# Patient Record
Sex: Male | Born: 1985
Health system: Southern US, Community
[De-identification: ages and names within clinical notes are randomized; demographics above are authoritative.]

## PROBLEM LIST (undated history)

## (undated) ENCOUNTER — Emergency Department: Admission: EM | Payer: BC Managed Care – PPO | Source: Home / Self Care

## (undated) DIAGNOSIS — K219 Gastro-esophageal reflux disease without esophagitis: Secondary | ICD-10-CM

## (undated) DIAGNOSIS — K5190 Ulcerative colitis, unspecified, without complications: Secondary | ICD-10-CM

## (undated) DIAGNOSIS — E119 Type 2 diabetes mellitus without complications: Secondary | ICD-10-CM

## (undated) HISTORY — PX: TYMPANOSTOMY TUBE PLACEMENT: SHX32

---

## 2019-02-07 DIAGNOSIS — K219 Gastro-esophageal reflux disease without esophagitis: Secondary | ICD-10-CM | POA: Insufficient documentation

## 2019-02-07 DIAGNOSIS — F17290 Nicotine dependence, other tobacco product, uncomplicated: Secondary | ICD-10-CM | POA: Insufficient documentation

## 2019-02-07 DIAGNOSIS — Z79899 Other long term (current) drug therapy: Secondary | ICD-10-CM | POA: Insufficient documentation

## 2019-02-07 DIAGNOSIS — Z6831 Body mass index (BMI) 31.0-31.9, adult: Secondary | ICD-10-CM | POA: Insufficient documentation

## 2019-10-30 DIAGNOSIS — F909 Attention-deficit hyperactivity disorder, unspecified type: Secondary | ICD-10-CM | POA: Diagnosis not present

## 2019-10-30 DIAGNOSIS — F322 Major depressive disorder, single episode, severe without psychotic features: Secondary | ICD-10-CM | POA: Diagnosis not present

## 2019-10-30 DIAGNOSIS — K219 Gastro-esophageal reflux disease without esophagitis: Secondary | ICD-10-CM | POA: Diagnosis not present

## 2019-12-25 DIAGNOSIS — F322 Major depressive disorder, single episode, severe without psychotic features: Secondary | ICD-10-CM | POA: Diagnosis not present

## 2019-12-25 DIAGNOSIS — F909 Attention-deficit hyperactivity disorder, unspecified type: Secondary | ICD-10-CM | POA: Diagnosis not present

## 2020-02-26 DIAGNOSIS — F909 Attention-deficit hyperactivity disorder, unspecified type: Secondary | ICD-10-CM | POA: Diagnosis not present

## 2020-02-26 DIAGNOSIS — F322 Major depressive disorder, single episode, severe without psychotic features: Secondary | ICD-10-CM | POA: Diagnosis not present

## 2020-09-14 DIAGNOSIS — F322 Major depressive disorder, single episode, severe without psychotic features: Secondary | ICD-10-CM | POA: Diagnosis not present

## 2020-09-14 DIAGNOSIS — F909 Attention-deficit hyperactivity disorder, unspecified type: Secondary | ICD-10-CM | POA: Diagnosis not present

## 2020-12-30 ENCOUNTER — Encounter: Payer: Self-pay | Admitting: Emergency Medicine

## 2020-12-30 ENCOUNTER — Observation Stay
Admission: EM | Admit: 2020-12-30 | Discharge: 2021-01-01 | Disposition: A | Discharge status: Home / Self Care | Payer: BC Managed Care – PPO | Attending: Internal Medicine | Admitting: Internal Medicine

## 2020-12-30 ENCOUNTER — Other Ambulatory Visit: Payer: Self-pay

## 2020-12-30 ENCOUNTER — Emergency Department: Payer: BC Managed Care – PPO

## 2020-12-30 DIAGNOSIS — Z20822 Contact with and (suspected) exposure to covid-19: Secondary | ICD-10-CM | POA: Diagnosis not present

## 2020-12-30 DIAGNOSIS — K5100 Ulcerative (chronic) pancolitis without complications: Principal | ICD-10-CM | POA: Insufficient documentation

## 2020-12-30 DIAGNOSIS — K760 Fatty (change of) liver, not elsewhere classified: Secondary | ICD-10-CM | POA: Diagnosis not present

## 2020-12-30 DIAGNOSIS — F32A Depression, unspecified: Secondary | ICD-10-CM | POA: Diagnosis present

## 2020-12-30 DIAGNOSIS — Z720 Tobacco use: Secondary | ICD-10-CM | POA: Diagnosis present

## 2020-12-30 DIAGNOSIS — K5190 Ulcerative colitis, unspecified, without complications: Secondary | ICD-10-CM | POA: Diagnosis present

## 2020-12-30 DIAGNOSIS — Z79899 Other long term (current) drug therapy: Secondary | ICD-10-CM | POA: Diagnosis not present

## 2020-12-30 DIAGNOSIS — K8020 Calculus of gallbladder without cholecystitis without obstruction: Secondary | ICD-10-CM | POA: Diagnosis not present

## 2020-12-30 DIAGNOSIS — K869 Disease of pancreas, unspecified: Secondary | ICD-10-CM | POA: Diagnosis present

## 2020-12-30 DIAGNOSIS — R1032 Left lower quadrant pain: Secondary | ICD-10-CM | POA: Diagnosis not present

## 2020-12-30 DIAGNOSIS — K529 Noninfective gastroenteritis and colitis, unspecified: Secondary | ICD-10-CM

## 2020-12-30 DIAGNOSIS — F909 Attention-deficit hyperactivity disorder, unspecified type: Secondary | ICD-10-CM | POA: Diagnosis present

## 2020-12-30 LAB — URINALYSIS, COMPLETE (UACMP) WITH MICROSCOPIC
Bilirubin Urine: NEGATIVE
Glucose, UA: NEGATIVE mg/dL
Ketones, ur: 20 mg/dL — AB
Leukocytes,Ua: NEGATIVE
Nitrite: NEGATIVE
Protein, ur: NEGATIVE mg/dL
Specific Gravity, Urine: 1.018 (ref 1.005–1.030)
Squamous Epithelial / HPF: NONE SEEN (ref 0–5)
pH: 6 (ref 5.0–8.0)

## 2020-12-30 LAB — CBC
HCT: 47.2 % (ref 39.0–52.0)
Hemoglobin: 17 g/dL (ref 13.0–17.0)
MCH: 31.8 pg (ref 26.0–34.0)
MCHC: 36 g/dL (ref 30.0–36.0)
MCV: 88.4 fL (ref 80.0–100.0)
Platelets: 208 10*3/uL (ref 150–400)
RBC: 5.34 MIL/uL (ref 4.22–5.81)
RDW: 12.3 % (ref 11.5–15.5)
WBC: 9.4 10*3/uL (ref 4.0–10.5)
nRBC: 0 % (ref 0.0–0.2)

## 2020-12-30 LAB — COMPREHENSIVE METABOLIC PANEL
ALT: 29 U/L (ref 0–44)
AST: 18 U/L (ref 15–41)
Albumin: 4.5 g/dL (ref 3.5–5.0)
Alkaline Phosphatase: 49 U/L (ref 38–126)
Anion gap: 9 (ref 5–15)
BUN: 8 mg/dL (ref 6–20)
CO2: 25 mmol/L (ref 22–32)
Calcium: 9 mg/dL (ref 8.9–10.3)
Chloride: 104 mmol/L (ref 98–111)
Creatinine, Ser: 1.04 mg/dL (ref 0.61–1.24)
GFR, Estimated: >60 mL/min (ref >60)
Glucose, Bld: 115 mg/dL — ABNORMAL HIGH (ref 70–99)
Potassium: 4 mmol/L (ref 3.5–5.1)
Sodium: 138 mmol/L (ref 135–145)
Total Bilirubin: 0.9 mg/dL (ref 0.3–1.2)
Total Protein: 7.6 g/dL (ref 6.5–8.1)

## 2020-12-30 LAB — GASTROINTESTINAL PANEL BY PCR, STOOL (REPLACES STOOL CULTURE)

## 2020-12-30 LAB — C DIFFICILE QUICK SCREEN W PCR REFLEX
C Diff antigen: NEGATIVE
C Diff interpretation: NOT DETECTED
C Diff toxin: NEGATIVE

## 2020-12-30 LAB — RESP PANEL BY RT-PCR (FLU A&B, COVID) ARPGX2
Influenza A by PCR: NEGATIVE
Influenza B by PCR: NEGATIVE
SARS Coronavirus 2 by RT PCR: NEGATIVE

## 2020-12-30 LAB — LIPASE, BLOOD: Lipase: 27 U/L (ref 11–51)

## 2020-12-30 MED ORDER — BUPROPION HCL ER (XL) 150 MG PO TB24
450.0000 mg | ORAL_TABLET | Freq: Every day | ORAL | Status: DC
  Administered 2020-12-31 – 2021-01-01 (×2): 450 mg via ORAL
  Filled 2020-12-30 (×2): qty 3

## 2020-12-30 MED ORDER — MORPHINE SULFATE (PF) 2 MG/ML IV SOLN
1.0000 mg | INTRAVENOUS | Status: DC | PRN
  Administered 2020-12-31 (×2): 1 mg via INTRAVENOUS
  Filled 2020-12-30 (×2): qty 1

## 2020-12-30 MED ORDER — ONDANSETRON HCL 4 MG/2ML IJ SOLN
4.0000 mg | Freq: Once | INTRAMUSCULAR | Status: AC
  Administered 2020-12-30: 4 mg via INTRAVENOUS
  Filled 2020-12-30: qty 2

## 2020-12-30 MED ORDER — ONDANSETRON HCL 4 MG PO TABS: 4.0000 mg | ORAL_TABLET | Freq: Four times a day (QID) | ORAL | Status: AC | PRN

## 2020-12-30 MED ORDER — HYDROCODONE-ACETAMINOPHEN 5-325 MG PO TABS
1.0000 | ORAL_TABLET | Freq: Four times a day (QID) | ORAL | Status: AC | PRN
  Administered 2020-12-31: 1 via ORAL
  Filled 2020-12-30: qty 1

## 2020-12-30 MED ORDER — AMPHETAMINE-DEXTROAMPHET ER 5 MG PO CP24
15.0000 mg | ORAL_CAPSULE | Freq: Every morning | ORAL | Status: DC
  Administered 2020-12-31: 15 mg via ORAL
  Filled 2020-12-30 (×2): qty 3

## 2020-12-30 MED ORDER — METRONIDAZOLE IN NACL 5-0.79 MG/ML-% IV SOLN
500.0000 mg | Freq: Once | INTRAVENOUS | Status: AC
  Administered 2020-12-30: 500 mg via INTRAVENOUS
  Filled 2020-12-30: qty 100

## 2020-12-30 MED ORDER — CIPROFLOXACIN IN D5W 400 MG/200ML IV SOLN
400.0000 mg | Freq: Once | INTRAVENOUS | Status: AC
  Administered 2020-12-30: 400 mg via INTRAVENOUS
  Filled 2020-12-30: qty 200

## 2020-12-30 MED ORDER — METRONIDAZOLE IN NACL 5-0.79 MG/ML-% IV SOLN
500.0000 mg | Freq: Three times a day (TID) | INTRAVENOUS | Status: DC
  Administered 2020-12-31 – 2021-01-01 (×4): 500 mg via INTRAVENOUS
  Filled 2020-12-30 (×7): qty 100

## 2020-12-30 MED ORDER — NICOTINE 14 MG/24HR TD PT24
14.0000 mg | MEDICATED_PATCH | Freq: Every day | TRANSDERMAL | Status: DC
  Filled 2020-12-30 (×3): qty 1

## 2020-12-30 MED ORDER — ONDANSETRON HCL 4 MG/2ML IJ SOLN
4.0000 mg | Freq: Four times a day (QID) | INTRAMUSCULAR | Status: AC | PRN
  Administered 2020-12-30 – 2020-12-31 (×3): 4 mg via INTRAVENOUS
  Filled 2020-12-30 (×3): qty 2

## 2020-12-30 MED ORDER — ENOXAPARIN SODIUM 40 MG/0.4ML ~~LOC~~ SOLN
40.0000 mg | SUBCUTANEOUS | Status: DC
  Administered 2020-12-30: 40 mg via SUBCUTANEOUS
  Filled 2020-12-30: qty 0.4

## 2020-12-30 MED ORDER — CIPROFLOXACIN IN D5W 400 MG/200ML IV SOLN
400.0000 mg | Freq: Two times a day (BID) | INTRAVENOUS | Status: DC
  Administered 2020-12-31 – 2021-01-01 (×3): 400 mg via INTRAVENOUS
  Filled 2020-12-30 (×4): qty 200

## 2020-12-30 MED ORDER — SODIUM CHLORIDE 0.9 % IV BOLUS
1000.0000 mL | Freq: Once | INTRAVENOUS | Status: AC
  Administered 2020-12-30: 1000 mL via INTRAVENOUS

## 2020-12-30 MED ORDER — HYDROCODONE-ACETAMINOPHEN 5-325 MG PO TABS
1.0000 | ORAL_TABLET | Freq: Once | ORAL | Status: AC
  Administered 2020-12-30: 1 via ORAL
  Filled 2020-12-30: qty 1

## 2020-12-30 MED ORDER — PANTOPRAZOLE SODIUM 40 MG PO TBEC
40.0000 mg | DELAYED_RELEASE_TABLET | Freq: Every day | ORAL | Status: DC
  Administered 2020-12-30 – 2021-01-01 (×3): 40 mg via ORAL
  Filled 2020-12-30 (×3): qty 1

## 2020-12-30 MED ORDER — ACETAMINOPHEN 650 MG RE SUPP: 325.0000 mg | Freq: Four times a day (QID) | RECTAL | Status: DC | PRN

## 2020-12-30 MED ORDER — ACETAMINOPHEN 325 MG PO TABS: 325.0000 mg | ORAL_TABLET | Freq: Four times a day (QID) | ORAL | Status: DC | PRN

## 2020-12-30 NOTE — ED Notes (Signed)
Pt moved from minor care to room 31, pt states that his pain is much better than it was when he got here and rates it a 2/10, pt oriented to his call light, no distress noted and will cont to monitor

## 2020-12-30 NOTE — ED Triage Notes (Signed)
Pt c/o LLQ abdominal pain and constipation x several days. Pt states has not had BM in several days, last BM was Friday night.

## 2020-12-30 NOTE — ED Provider Notes (Signed)
[facility] Emergency Department Provider Note  Time seen: 4:00 PM  I have reviewed the triage vital signs and the nursing notes.   HISTORY  Chief Complaint Abdominal Pain   HPI Brent [name] is a 35 y.o. male with no significant past medical history presents to the emergency department for 5 days of left lower quadrant abdominal pain.  Cording to the patient for the past 5 days he has been experiencing moderate dull aching pain in the left lower quadrant.  States he has been somewhat constipated but occasionally has loose stool.  States nausea vomiting 4 days ago but none since.  Denies any fever at any point.   History reviewed. No pertinent past medical history.  There are no problems to display for this patient.    Prior to Admission medications   Not on File    No Known Allergies  No family history on file.  Social History Social History   Tobacco Use  . Smoking status: Never Smoker  . Smokeless tobacco: Never Used  Vaping Use  . Vaping Use: Every day  Substance Use Topics  . Alcohol use: Not Currently  . Drug use: Not Currently    Review of Systems Constitutional: Negative for fever. Cardiovascular: Negative for chest pain. Respiratory: Negative for shortness of breath. Gastrointestinal: Left lower quadrant abdominal pain. Genitourinary: Negative for urinary compaints, dysuria or hematuria. Musculoskeletal: Negative for musculoskeletal complaints Neurological: Negative for headache All other ROS negative  ____________________________________________   PHYSICAL EXAM:  VITAL SIGNS: ED Triage Vitals  Enc Vitals Group     BP 12/30/20 1519 (!) 147/98     Pulse Rate 12/30/20 1519 (!) 110     Resp 12/30/20 1519 (!) 22     Temp 12/30/20 1519 98.1 F (36.7 C)     Temp Source 12/30/20 1519 Oral     SpO2 12/30/20 1519 97 %     Weight 12/30/20 1521 190 lb (86.2 kg)     Height 12/30/20 1521 5\' 11"  (1.803 m)     Head Circumference  --      Peak Flow --      Pain Score 12/30/20 1520 5     Pain Loc --      Pain Edu? --      Excl. in GC? --     Constitutional: Alert and oriented. Well appearing and in no distress. Eyes: Normal exam ENT      Head: Normocephalic and atraumatic.      Mouth/Throat: Mucous membranes are moist. Cardiovascular: Normal rate, regular rhythm.  Respiratory: Normal respiratory effort without tachypnea nor retractions. Breath sounds are clear Gastrointestinal: Soft, mild left lower quadrant tenderness palpation without rebound guarding or distention.  No CVA tenderness. Musculoskeletal: Nontender with normal range of motion in all extremities.  Neurologic:  Normal speech and language. No gross focal neurologic deficits  Skin:  Skin is warm, dry and intact.  Psychiatric: Mood and affect are normal.  ____________________________________________    EKG  EKG viewed and interpreted by myself shows sinus tachycardia 126 bpm with a narrow QRS, normal axis, normal intervals, nonspecific ST changes.  ____________________________________________    RADIOLOGY  CT shows significant pancolitis as well as a density within his pancreas.  ____________________________________________   INITIAL IMPRESSION / ASSESSMENT AND PLAN / ED COURSE  Pertinent labs & imaging results that were available during my care of the patient were reviewed by me and considered in my medical decision making (see chart for details).  Patient presents emergency department for left flank pain ongoing for the past 5 days, states 6/10 moderate dull pain to the left lower quadrant.  Differential would include colitis, diverticulitis, constipation, less likely UTI or pyelonephritis, ureterolithiasis.  We will obtain CT renal scan to further evaluate.  Patient's lab work is largely nonrevealing, urinalysis pending.  CT shows significant pancolitis as well as a pancreatic density.  Given the degree of pancolitis I discussed with  the patient admission to the hospital versus discharge home.  Patient states he was hurting extremely badly at home and has had vomiting at home previously he is worried that he will not be able to tolerate his medications at home which I believe hospitalization is reasonable given the degree of colitis.  I discussed IBD possibilities with the patient he states his sister was diagnosed with "colitis" but is not sure if this was inflammatory or bacterial.  Given the degree of colitis will cover with antibiotics and admit to the hospitalist service for further work-up and treatment as well as possible GI consultation whether inpatient or outpatient.  Patient will also need further work-up for his pancreatic density finding.  Patient agreeable plan of care.  Brent [name] was evaluated in Emergency Department on 12/30/2020 for the symptoms described in the history of present illness. He was evaluated in the context of the global COVID-19 pandemic, which necessitated consideration that the patient might be at risk for infection with the SARS-CoV-2 virus that causes COVID-19. Institutional protocols and algorithms that pertain to the evaluation of patients at risk for COVID-19 are in a state of rapid change based on information released by regulatory bodies including the CDC and federal and state organizations. These policies and algorithms were followed during the patient's care in the ED.  ____________________________________________   FINAL CLINICAL IMPRESSION(S) / ED DIAGNOSES  Left lower quadrant abdominal pain Pancolitis   Minna Antis, MD 12/30/20 2002

## 2020-12-30 NOTE — Consult Note (Signed)
Pharmacy Antibiotic Note  Brent [name] is a 35 y.o. male admitted on 12/30/2020 with colitis.  Pharmacy has been consulted for metronidazole and ciprofloxacin dosing.  Plan: Ciprofloxacin 400 mg IV q12h  Metronidazole 500 mg q8h Monitor clinical picture and renal function F/U C&S, abx deescalation / LOT   Height: 5\' 11"  (180.3 cm) Weight: 86.2 kg (190 lb) IBW/kg (Calculated) : 75.3  Temp (24hrs), Avg:98.1 F (36.7 C), Min:98.1 F (36.7 C), Max:98.1 F (36.7 C)  Recent Labs  Lab 12/30/20 1528  WBC 9.4  CREATININE 1.04    Estimated Creatinine Clearance: 108.6 mL/min (by C-G formula based on SCr of 1.04 mg/dL).    Allergies  Allergen Reactions  . Wasp Venom Protein Shortness Of Breath    Antimicrobials this admission: Ciprofloxacin 3/23 >>  Metronidazole 3/23 >>   Dose adjustments this admission:  Microbiology results: 3/23 C Diff panel: Pending 3/23 GI panel: pending  Thank you for allowing pharmacy to be a part of this patient's care.  4/23, PharmD 12/30/2020 6:37 PM

## 2020-12-30 NOTE — ED Notes (Signed)
Dr. Cox at bedside.  

## 2020-12-30 NOTE — ED Notes (Signed)
See triage note, pt reports LLQ abdominal pain that started Sunday. Has not been able to have BM since Friday. Denies vomiting since Friday.  Pt ambulatory to treatment room Denies being able to provide urine sample at this time

## 2020-12-30 NOTE — ED Notes (Signed)
Report messaged to mia rn floor nurse 

## 2020-12-30 NOTE — H&P (Signed)
History and Physical   Brent [name] DDU:[id number] DOB: 06/06/1986 DOA: 12/30/2020  PCP: [facility]  Patient coming from: Home  I have personally briefly reviewed patient's old medical records in [facility] EMR.  Chief Concern: Abdominal pain  HPI: Brent [name] is a 35 y.o. male with medical history significant for depression, ADHD, presents to the emergency department for chief concerns of abdominal pain.   Mr. Bodey reports the abdominal pain started on Sunday, 12/27/2020.  He describes the pain as initially as 10 out of 10 and improved to a 2 out of 10, the pain is constant.  He reports the pain is achy and throbbing.  He reports no p.o. intake since 12/29/2020 due to abdominal pain.  He reports he has never had pain like this before.  Reports his last bowel movement was Friday, 12/25/2020 and states that the color is light brown and denies blood.  He states that the stool consistency was loose and denies watery stool.  Since the abdominal pain started he denies nausea and vomiting.  He does endorse 2 episodes of vomiting on Friday, 12/25/2020 while he was at work.  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  He denies sick contacts and changes to his diet.  Per his knowledge he does not know of anyone that he has worked with her, and to contact with that has experienced the symptoms.  Social history: He lives by himself.  He vapes nicotine daily.  He denies EtOH and recreational drug use.  He works in IT consultant.  Vaccination: Patient is vaccinated with COVID-19 x2 doses.  He denies having a booster.  Family medical history: his sister recently was diagnosed with ulcerative colitis.  ROS: Constitutional: no weight change, no fever ENT/Mouth: no sore throat, no rhinorrhea Eyes: no eye pain, no vision changes Cardiovascular: no chest pain, no dyspnea,  no edema, no palpitations Respiratory: no cough, no  sputum, no wheezing Gastrointestinal:+ nausea, + vomiting, no diarrhea, no constipation. Abdominal pain Genitourinary: no urinary incontinence, no dysuria, no hematuria Musculoskeletal: no arthralgias, no myalgias Skin: no skin lesions, no pruritus, Neuro: no weakness, no loss of consciousness, no syncope Psych: no anxiety, no depression, + decrease appetite Heme/Lymph: no bruising, no bleeding  ED Course: Discussed with ED provider, patient requiring hospitalization due to abdominal pain with CT evidence of profound colonic thickening and moderate pericolonic stranding suspicious for colitis.  Vitals in the emergency department was reassuring with temperature of 98.1, respiration rate of 22, heart rate of 110, blood pressure 147/98, satting at 97% on room air.  Labs in the emergency department was reassuring with no elevated WBC, normal hemoglobin, normal platelets, serum sodium of 138, potassium 4.0, chloride 104, bicarb 25, BUN 8, serum creatinine of 1.04, nonfasting blood sugar of 115.  He was given normal saline 1 L bolus, Norco 1 tablet 5 mg, and started on ciprofloxacin 400 mg IV, and metronidazole 500 mg IV per ED provider.  Assessment/Plan  Principal Problem:   Colitis Active Problems:   Pancreatic lesion   Adult ADHD   Tobacco abuse   Depression   # Abdominal pain with CT evidence of colitis -Infectious etiology cannot be excluded -C. difficile and GI PCR panel ordered -Suspect inflammatory colitis and patient would benefit from colonoscopy either inpatient or outpatient -Pain control with Norco 5 mg every 6 hours.  For moderate pain, morphine 1 mg every 2 hours.  For severe pain, 3 doses ordered -  Continued antibiotic with ciprofloxacin and metronidazole  # Possible pancreatic lesion at the splenic hilum-per CT read -Discussed with patient and extensively advised patient to follow-up CT or MRI in 1 to 2 weeks outpatient -Patient endorses understanding and compliance  #  ADHD-resumed Adderall 24 hours, 50 mg p.o. every morning  # Depression-denies SI, HI -Resumed bupropion 450 mg p.o. daily  # Tobacco abuse-nicotine patch ordered -Counseling given  Chart reviewed.   DVT prophylaxis: TED hose, enoxaparin Code Status: Full code Diet: Clear liquid Family Communication: No Disposition Plan: Pending clinical course Consults called: None at this time Admission status: Observation, MedSurg, no telemetry  History reviewed. No pertinent past medical history.  Social History:  reports that he has never smoked. He has never used smokeless tobacco. He reports previous alcohol use. He reports previous drug use.  Allergies  Allergen Reactions  . Wasp Venom Protein Shortness Of Breath   No family history on file. Family history: Family history reviewed and pertinent for sister being diagnosed with ulcerative colitis  Prior to Admission medications   Medication Sig Start Date End Date Taking? Authorizing Provider  amphetamine-dextroamphetamine (ADDERALL XR) 15 MG 24 hr capsule Take 15 mg by mouth every morning. 12/23/20  Yes [provider]  buPROPion (WELLBUTRIN XL) 150 MG 24 hr tablet Take 450 mg by mouth daily. 12/01/20  Yes [provider]  omeprazole (PRILOSEC) 20 MG capsule Take 20 mg by mouth daily.   Yes [provider]  calcium carbonate (TUMS - DOSED IN MG ELEMENTAL CALCIUM) 500 MG chewable tablet Chew 1 tablet by mouth daily.    [provider]   Physical Exam: Vitals:   12/30/20 1519 12/30/20 1521 12/30/20 1743 12/30/20 1830  BP: (!) 147/98  (!) 126/93 133/83  Pulse: (!) 110  89 80  Resp: (!) 22  16   Temp: 98.1 F (36.7 C)     TempSrc: Oral     SpO2: 97%  98% 99%  Weight:  86.2 kg    Height:  5\' 11"  (1.803 m)     Constitutional: appears age-appropriate, NAD, calm, comfortable Eyes: PERRL, lids and conjunctivae normal ENMT: Mucous membranes are moist. Posterior pharynx clear of any exudate or lesions.  Age-appropriate dentition. Hearing appropriate Neck: normal, supple, no masses, no thyromegaly Respiratory: clear to auscultation bilaterally, no wheezing, no crackles. Normal respiratory effort. No accessory muscle use.  Cardiovascular: Regular rate and rhythm, no murmurs / rubs / gallops. No extremity edema. 2+ pedal pulses. No carotid bruits.  Abdomen: Mild left lower quadrant abdominal tenderness with deep palpation, no masses palpated, no hepatosplenomegaly. Bowel sounds positive.  Musculoskeletal: no clubbing / cyanosis. No joint deformity upper and lower extremities. Good ROM, no contractures, no atrophy. Normal muscle tone.  Skin: no rashes, lesions, ulcers. No induration Neurologic: Sensation intact. Strength 5/5 in all 4.  Psychiatric: Normal judgment and insight. Alert and oriented x 3. Normal mood.   EKG: independently reviewed, showing sinus tachycardia with rate of 126, QTc 443  Imaging on Admission: I personally reviewed and I agree with radiologist reading as below.  CT Renal Stone Study  Addendum Date: 12/30/2020   ADDENDUM REPORT: 12/30/2020 16:54 ADDENDUM: These results were called by telephone at the time of interpretation on 12/30/2020 at 4:54 pm to provider [facility] , who verbally acknowledged these results. Electronically Signed   By: [facility] M.D.   On: 12/30/2020 16:54   Result Date: 12/30/2020 CLINICAL DATA:  Flank pain, suspected kidney stone in a 35 year old  male with LEFT lower quadrant abdominal pain and constipation for several days. EXAM: CT ABDOMEN AND PELVIS WITHOUT CONTRAST TECHNIQUE: Multidetector CT imaging of the abdomen and pelvis was performed following the standard protocol without IV contrast. COMPARISON:  None FINDINGS: Lower chest: Lung bases are clear. Hepatobiliary: Severe hepatic steatosis. Liver contour is smooth. Cholelithiasis without pericholecystic stranding. Pancreas: Change in density of the pancreas towards the splenic hilum. When  viewed in the coronal plane area of more confluent soft tissue measuring approximately 1.8 cm, potentially variant parenchyma in this location though difficult to exclude underlying lesion. No peripancreatic stranding. Spleen: Smooth contour of the spleen.  No focal lesion. Adrenals/Urinary Tract: Adrenal glands are normal. Smooth renal contours. Urinary bladder under distended. No nephrolithiasis. No ureteral calculi. No perinephric stranding. No hydronephrosis. Small LEFT extrarenal pelvis. Stomach/Bowel: Stomach and small bowel without acute process proximally. Mural stratification of the mid to distal ileum. No significant perienteric stranding. No sign of bowel obstruction. Appendix is normal. Beginning in the ascending colon there is profound colonic thickening and moderate pericolonic stranding. This extends through the sigmoid colon and potentially into the rectum no pneumatosis. Vascular/Lymphatic: Normal caliber abdominal aorta. Smooth contour the IVC. The There is no gastrohepatic or hepatoduodenal ligament lymphadenopathy. No retroperitoneal or mesenteric lymphadenopathy. No pelvic sidewall lymphadenopathy. Reproductive: Prostate unremarkable by CT. Other: No ascites. Musculoskeletal: No acute musculoskeletal process. IMPRESSION: 1. Profound colonic thickening and moderate pericolonic stranding. This extends through the sigmoid colon and potentially into the rectum. Findings are suspicious for colitis, more likely infectious or inflammatory. Given the distribution of involvement would suggest correlation with any recent antibiotic therapy and exclusion of process such as C difficile colitis. Presence of potential ongoing or prior enteritis would support the possibility of inflammatory bowel disease but is nonspecific. 2. Severe hepatic steatosis. 3. Cholelithiasis without evidence of acute cholecystitis. 4. Change in density of the pancreas towards the splenic hilum. When viewed in the coronal plane area  of more confluent soft tissue in this location makes it difficult to exclude underlying lesion. Consider follow-up with contrasted CT or MRI. This does not appear to represent an acute process. Given the possibility of underlying lesion prompt follow-up is however suggested. This could be performed as an outpatient particularly if MRI is considered as MRI is often of better quality outside of the acute setting. Electronically Signed: By: Donzetta Kohut M.D. On: 12/30/2020 16:48   Labs on Admission: I have personally reviewed following labs  CBC: Recent Labs  Lab 12/30/20 1528  WBC 9.4  HGB 17.0  HCT 47.2  MCV 88.4  PLT 208   Basic Metabolic Panel: Recent Labs  Lab 12/30/20 1528  NA 138  K 4.0  CL 104  CO2 25  GLUCOSE 115*  BUN 8  CREATININE 1.04  CALCIUM 9.0   GFR: Estimated Creatinine Clearance: 108.6 mL/min (by C-G formula based on SCr of 1.04 mg/dL).  Liver Function Tests: Recent Labs  Lab 12/30/20 1528  AST 18  ALT 29  ALKPHOS 49  BILITOT 0.9  PROT 7.6  ALBUMIN 4.5   Recent Labs  Lab 12/30/20 1528  LIPASE 27   Urine analysis:    Component Value Date/Time   COLORURINE YELLOW (A) 12/30/2020 1528   APPEARANCEUR CLEAR (A) 12/30/2020 1528   LABSPEC 1.018 12/30/2020 1528   PHURINE 6.0 12/30/2020 1528   GLUCOSEU NEGATIVE 12/30/2020 1528   HGBUR SMALL (A) 12/30/2020 1528   BILIRUBINUR NEGATIVE 12/30/2020 1528   KETONESUR 20 (A) 12/30/2020 1528  PROTEINUR NEGATIVE 12/30/2020 1528   NITRITE NEGATIVE 12/30/2020 1528   LEUKOCYTESUR NEGATIVE 12/30/2020 1528   Amy N Cox D.O. [facility]  If 7PM-7AM, please contact overnight-coverage provider If 7AM-7PM, please contact day coverage provider [email or web address]  12/30/2020, 8:06 PM

## 2020-12-30 NOTE — ED Notes (Signed)
Nausea meds given.

## 2020-12-31 ENCOUNTER — Encounter: Payer: Self-pay | Admitting: Internal Medicine

## 2020-12-31 DIAGNOSIS — K869 Disease of pancreas, unspecified: Secondary | ICD-10-CM | POA: Diagnosis not present

## 2020-12-31 DIAGNOSIS — K529 Noninfective gastroenteritis and colitis, unspecified: Secondary | ICD-10-CM | POA: Diagnosis not present

## 2020-12-31 LAB — BASIC METABOLIC PANEL
Anion gap: 6 (ref 5–15)
BUN: 8 mg/dL (ref 6–20)
CO2: 25 mmol/L (ref 22–32)
Calcium: 8.4 mg/dL — ABNORMAL LOW (ref 8.9–10.3)
Chloride: 108 mmol/L (ref 98–111)
Creatinine, Ser: 1.04 mg/dL (ref 0.61–1.24)
GFR, Estimated: >60 mL/min (ref >60)
Glucose, Bld: 113 mg/dL — ABNORMAL HIGH (ref 70–99)
Potassium: 3.8 mmol/L (ref 3.5–5.1)
Sodium: 139 mmol/L (ref 135–145)

## 2020-12-31 LAB — CBC
HCT: 43.4 % (ref 39.0–52.0)
Hemoglobin: 15.7 g/dL (ref 13.0–17.0)
MCH: 31.9 pg (ref 26.0–34.0)
MCHC: 36.2 g/dL — ABNORMAL HIGH (ref 30.0–36.0)
MCV: 88.2 fL (ref 80.0–100.0)
Platelets: 172 10*3/uL (ref 150–400)
RBC: 4.92 MIL/uL (ref 4.22–5.81)
RDW: 12.4 % (ref 11.5–15.5)
WBC: 7.3 10*3/uL (ref 4.0–10.5)
nRBC: 0 % (ref 0.0–0.2)

## 2020-12-31 LAB — HIV ANTIBODY (ROUTINE TESTING W REFLEX): HIV Screen 4th Generation wRfx: NONREACTIVE

## 2020-12-31 MED ORDER — SODIUM CHLORIDE 0.9 % IV SOLN: INTRAVENOUS | Status: DC

## 2020-12-31 MED ORDER — SODIUM CHLORIDE 0.9 % IV SOLN
INTRAVENOUS | Status: DC
  Administered 2021-01-01: 20 mL/h via INTRAVENOUS

## 2020-12-31 MED ORDER — POLYETHYLENE GLYCOL 3350 17 G PO PACK
34.0000 g | PACK | Freq: Two times a day (BID) | ORAL | Status: DC
  Filled 2020-12-31: qty 2

## 2020-12-31 MED ORDER — POLYETHYLENE GLYCOL 3350 17 G PO PACK
34.0000 g | PACK | Freq: Two times a day (BID) | ORAL | Status: DC
  Administered 2020-12-31: 34 g via ORAL
  Filled 2020-12-31: qty 2

## 2020-12-31 MED ORDER — POLYETHYLENE GLYCOL 3350 17 GM/SCOOP PO POWD
1.0000 | Freq: Once | ORAL | Status: AC
  Administered 2020-12-31: 255 g via ORAL
  Filled 2020-12-31 (×2): qty 255

## 2020-12-31 MED ORDER — ONDANSETRON HCL 4 MG/2ML IJ SOLN
4.0000 mg | Freq: Four times a day (QID) | INTRAMUSCULAR | Status: DC | PRN
  Administered 2021-01-01 (×2): 4 mg via INTRAVENOUS
  Filled 2020-12-31 (×2): qty 2

## 2020-12-31 NOTE — Consult Note (Signed)
Cephas Darby, MD [address]  [place]  [place], [place] [address]  Main: [phone]  Fax: [phone] Pager: [phone]   Consultation  Referring Provider:     No ref. provider found Primary Care Physician:  [place], [place] Primary Gastroenterologist: unassigned         Reason for Consultation:     Colitis  Date of Admission:  12/30/2020 Date of Consultation:  12/31/2020         HPI:   Brent [name] is a 35 y.o. male with no significant past medical history presented with left lower quadrant pain, rectal bleeding and constipation.  He did not have leukocytosis on presentation.  He also reported nausea and vomiting.  He underwent CT abdomen and pelvis which revealed pancolitis.  Therefore GI is consulted for further management.  Patient is empirically started on Cipro and Flagyl.  He reports feeling significantly better today.  He does not drink alcohol, does vape Sister diagnosed with ulcerative colitis about 2 or 3 years ago Patient denies any abdominal surgeries He denies any family history of GI malignancy  NSAIDs: None  Antiplts/Anticoagulants/Anti thrombotics: None  GI Procedures: None  History reviewed. No pertinent past medical history.   Prior to Admission medications   Medication Sig Start Date End Date Taking? Authorizing Provider  amphetamine-dextroamphetamine (ADDERALL XR) 15 MG 24 hr capsule Take 15 mg by mouth every morning. 12/23/20  Yes [provider]  buPROPion (WELLBUTRIN XL) 150 MG 24 hr tablet Take 450 mg by mouth daily. 12/01/20  Yes [provider]  omeprazole (PRILOSEC) 20 MG capsule Take 20 mg by mouth daily.   Yes [provider]  calcium carbonate (TUMS - DOSED IN MG ELEMENTAL CALCIUM) 500 MG chewable tablet Chew 1 tablet by mouth daily.    [provider]    Current Facility-Administered Medications:  .  acetaminophen (TYLENOL) tablet 325 mg, 325 mg, Oral, Q6H PRN **OR**  acetaminophen (TYLENOL) suppository 325 mg, 325 mg, Rectal, Q6H PRN, Cox, Amy N, DO .  amphetamine-dextroamphetamine (ADDERALL XR) 24 hr capsule 15 mg, 15 mg, Oral, q morning, Cox, Amy N, DO .  buPROPion (WELLBUTRIN XL) 24 hr tablet 450 mg, 450 mg, Oral, Daily, Cox, Amy N, DO, 450 mg at 12/31/20 0955 .  ciprofloxacin (CIPRO) IVPB 400 mg, 400 mg, Intravenous, Q12H, Darnelle Bos, RPH, Last Rate: 200 mL/hr at 12/31/20 0633, 400 mg at 12/31/20 0017 .  enoxaparin (LOVENOX) injection 40 mg, 40 mg, Subcutaneous, Q24H, Cox, Amy N, DO, 40 mg at 12/30/20 1946 .  HYDROcodone-acetaminophen (NORCO/VICODIN) 5-325 MG per tablet 1 tablet, 1 tablet, Oral, Q6H PRN, Cox, Amy N, DO, 1 tablet at 12/31/20 0808 .  metroNIDAZOLE (FLAGYL) IVPB 500 mg, 500 mg, Intravenous, Q8H, Darnelle Bos, RPH, Last Rate: 100 mL/hr at 12/31/20 0958, 500 mg at 12/31/20 0958 .  morphine 2 MG/ML injection 1 mg, 1 mg, Intravenous, Q2H PRN, Cox, Amy N, DO, 1 mg at 12/31/20 1035 .  nicotine (NICODERM CQ - dosed in mg/24 hours) patch 14 mg, 14 mg, Transdermal, Daily, Cox, Amy N, DO .  ondansetron (ZOFRAN) tablet 4 mg, 4 mg, Oral, Q6H PRN **OR** ondansetron (ZOFRAN) injection 4 mg, 4 mg, Intravenous, Q6H PRN, Cox, Amy N, DO, 4 mg at 12/31/20 0808 .  pantoprazole (PROTONIX) EC tablet 40 mg, 40 mg, Oral, Daily, Cox, Amy N, DO, 40 mg at 12/31/20 0955 .  polyethylene glycol (MIRALAX / GLYCOLAX) packet 34 g, 34  g, Oral, BID, Anabela Crayton, Tally Due, MD  No family history on file.   Social History   Tobacco Use  . Smoking status: Never Smoker  . Smokeless tobacco: Never Used  Vaping Use  . Vaping Use: Every day  Substance Use Topics  . Alcohol use: Not Currently  . Drug use: Not Currently    Allergies as of 12/30/2020 - Review Complete 12/30/2020  Allergen Reaction Noted  . Wasp venom protein Shortness Of Breath 02/07/2019    Review of Systems:    All systems reviewed and negative except where noted in HPI.   Physical Exam:  Vital  signs in last 24 hours: Temp:  [98 F (36.7 C)-98.5 F (36.9 C)] 98 F (36.7 C) (03/24 1121) Pulse Rate:  [74-110] 74 (03/24 1121) Resp:  [16-22] 16 (03/24 1121) BP: (117-147)/(73-98) 117/73 (03/24 1121) SpO2:  [96 %-99 %] 98 % (03/24 1121) Weight:  [86.2 kg] 86.2 kg (03/23 1521)   General:   Pleasant, cooperative in NAD Head:  Normocephalic and atraumatic. Eyes:   No icterus.   Conjunctiva pink. PERRLA. Ears:  Normal auditory acuity. Neck:  Supple; no masses or thyroidomegaly Lungs: Respirations even and unlabored. Lungs clear to auscultation bilaterally.   No wheezes, crackles, or rhonchi.  Heart:  Regular rate and rhythm;  Without murmur, clicks, rubs or gallops Abdomen:  Soft, nondistended, nontender. Normal bowel sounds. No appreciable masses or hepatomegaly.  No rebound or guarding.  Rectal:  Not performed. Msk:  Symmetrical without gross deformities.  Strength normal Extremities:  Without edema, cyanosis or clubbing. Neurologic:  Alert and oriented x3;  grossly normal neurologically. Skin:  Intact without significant lesions or rashes. Psych:  Alert and cooperative. Normal affect.  LAB RESULTS: CBC Latest Ref Rng & Units 12/31/2020 12/30/2020  WBC 4.0 - 10.5 K/uL 7.3 9.4  Hemoglobin 13.0 - 17.0 g/dL 15.7 17.0  Hematocrit 39.0 - 52.0 % 43.4 47.2  Platelets 150 - 400 K/uL 172 208    BMET BMP Latest Ref Rng & Units 12/31/2020 12/30/2020  Glucose 70 - 99 mg/dL 113(H) 115(H)  BUN 6 - 20 mg/dL 8 8  Creatinine 0.61 - 1.24 mg/dL 1.04 1.04  Sodium 135 - 145 mmol/L 139 138  Potassium 3.5 - 5.1 mmol/L 3.8 4.0  Chloride 98 - 111 mmol/L 108 104  CO2 22 - 32 mmol/L 25 25  Calcium 8.9 - 10.3 mg/dL 8.4(L) 9.0    LFT Hepatic Function Latest Ref Rng & Units 12/30/2020  Total Protein 6.5 - 8.1 g/dL 7.6  Albumin 3.5 - 5.0 g/dL 4.5  AST 15 - 41 U/L 18  ALT 0 - 44 U/L 29  Alk Phosphatase 38 - 126 U/L 49  Total Bilirubin 0.3 - 1.2 mg/dL 0.9     STUDIES: CT Renal Stone  Study  Addendum Date: 12/30/2020   ADDENDUM REPORT: 12/30/2020 16:54 ADDENDUM: These results were called by telephone at the time of interpretation on 12/30/2020 at 4:54 pm to provider [facility] , who verbally acknowledged these results. Electronically Signed   By: Zetta Bills M.D.   On: 12/30/2020 16:54   Result Date: 12/30/2020 CLINICAL DATA:  Flank pain, suspected kidney stone in a 35 year old male with LEFT lower quadrant abdominal pain and constipation for several days. EXAM: CT ABDOMEN AND PELVIS WITHOUT CONTRAST TECHNIQUE: Multidetector CT imaging of the abdomen and pelvis was performed following the standard protocol without IV contrast. COMPARISON:  None FINDINGS: Lower chest: Lung bases are clear. Hepatobiliary: Severe hepatic steatosis. Liver contour  is smooth. Cholelithiasis without pericholecystic stranding. Pancreas: Change in density of the pancreas towards the splenic hilum. When viewed in the coronal plane area of more confluent soft tissue measuring approximately 1.8 cm, potentially variant parenchyma in this location though difficult to exclude underlying lesion. No peripancreatic stranding. Spleen: Smooth contour of the spleen.  No focal lesion. Adrenals/Urinary Tract: Adrenal glands are normal. Smooth renal contours. Urinary bladder under distended. No nephrolithiasis. No ureteral calculi. No perinephric stranding. No hydronephrosis. Small LEFT extrarenal pelvis. Stomach/Bowel: Stomach and small bowel without acute process proximally. Mural stratification of the mid to distal ileum. No significant perienteric stranding. No sign of bowel obstruction. Appendix is normal. Beginning in the ascending colon there is profound colonic thickening and moderate pericolonic stranding. This extends through the sigmoid colon and potentially into the rectum no pneumatosis. Vascular/Lymphatic: Normal caliber abdominal aorta. Smooth contour the IVC. The There is no gastrohepatic or hepatoduodenal  ligament lymphadenopathy. No retroperitoneal or mesenteric lymphadenopathy. No pelvic sidewall lymphadenopathy. Reproductive: Prostate unremarkable by CT. Other: No ascites. Musculoskeletal: No acute musculoskeletal process. IMPRESSION: 1. Profound colonic thickening and moderate pericolonic stranding. This extends through the sigmoid colon and potentially into the rectum. Findings are suspicious for colitis, more likely infectious or inflammatory. Given the distribution of involvement would suggest correlation with any recent antibiotic therapy and exclusion of process such as C difficile colitis. Presence of potential ongoing or prior enteritis would support the possibility of inflammatory bowel disease but is nonspecific. 2. Severe hepatic steatosis. 3. Cholelithiasis without evidence of acute cholecystitis. 4. Change in density of the pancreas towards the splenic hilum. When viewed in the coronal plane area of more confluent soft tissue in this location makes it difficult to exclude underlying lesion. Consider follow-up with contrasted CT or MRI. This does not appear to represent an acute process. Given the possibility of underlying lesion prompt follow-up is however suggested. This could be performed as an outpatient particularly if MRI is considered as MRI is often of better quality outside of the acute setting. Electronically Signed: By: Zetta Bills M.D. On: 12/30/2020 16:48      Impression / Plan:   Brent [name] is a 35 y.o. male with history of ADHD, history of intermittent constipation is admitted with left lower quadrant pain, rectal bleeding and severe constipation, CT abdomen and pelvis revealed profound colonic thickening and moderate pericolonic stranding extending from the ascending colon to the sigmoid colon and potentially to the rectum.  Severe fatty liver, cholelithiasis  Pancolitis Stool studies negative for infection including C. difficile Start clear liquids today Aggressive  bowel regimen with MiraLAX 34 g twice daily Start bowel prep in the evening N.p.o. effective 5 AM tomorrow Plan for colonoscopy tomorrow patient has clear stools Continue empiric antibiotics for now  ?  Pancreatic lesion Recommend MRI pancreas protocol as outpatient  Thank you for involving me in the care of this patient.      LOS: 0 days   Sherri Sear, MD  12/31/2020, 12:25 PM   Note: This dictation was prepared with Dragon dictation along with smaller phrase technology. Any transcriptional errors that result from this process are unintentional.

## 2020-12-31 NOTE — Hospital Course (Signed)
35 y.o. male with medical history significant for depression, ADHD, presents to the emergency department for chief concerns of LLQ abdominal pain since Sunday 3/20, intolerance to oral intake since 3/22 due to pain, loose non-bloody stools.  Denied N/V.  Notably, pt's sister recently diagnosed with ulcerative colitis.  CT abd/pelvis renal stone protocol - Beginning in the ascending colon there is profound colonic thickening and moderate pericolonic stranding. This extends through the sigmoid colon and potentially into the rectum no pneumatosis.  Admitted for further evaluation and management of infectious vs inflammatory colitis.  C diff and GI pathogen panel were negative.  Started on empiric antibiotics, Cipro and Flagyl.

## 2020-12-31 NOTE — Progress Notes (Signed)
PROGRESS NOTE    Brent [name]   RUE:[id number]  DOB: Dec 15, 1985  PCP: Alvia Groveidge, [facility]    DOA: 12/30/2020 LOS: 0   Brief Narrative   35 y.o. male with medical history significant for depression, ADHD, presents to the emergency department for chief concerns of LLQ abdominal pain since "Sunday 3/20, intolerance to oral intake since 3/22 due to pain, loose non-bloody stools.  Denied N/V.  Notably, pt's sister recently diagnosed with ulcerative colitis.  CT abd/pelvis renal stone protocol - Beginning in the ascending colon there is profound colonic thickening and moderate pericolonic stranding. This extends through the sigmoid colon and potentially into the rectum no pneumatosis.  Admitted for further evaluation and management of infectious vs inflammatory colitis.  C diff and GI pathogen panel were negative.  Started on empiric antibiotics, Cipro and Flagyl.   Assessment & Plan   Principal Problem:   Colitis Active Problems:   Pancreatic lesion   Adult ADHD   Tobacco abuse   Depression   Pancolitis -present on mission with CT findings as outlined above.  C. difficile negative.  Family history of IBD, sister has ulcerative colitis. --GI following, appreciate recs --Plan for colonoscopy tomorrow --Bowel prep per GI orders and n.p.o. after 5 AM tomorrow --Clear liquids for now --Continue empiric Cipro and Flagyl  ?  Pancreatic lesion -see CT report.  GI recommends MRI pancreas protocol as outpatient.    ADHD -continue home Adderall  Depression -stable.  Continue home Wellbutrin  Tobacco abuse -nicotine patch ordered.  Counseled regarding importance of cessation.   Patient BMI: Body mass index is 26.5 kg/m.   DVT prophylaxis: enoxaparin (LOVENOX) injection 40 mg Start: 12/30/20 1900 Place TED hose Start: 12/30/20 1830   Diet:  Diet Orders (From admission, onward)    Start     Ordered   12/31/20 0001  Diet NPO time specified Except for: Ice  Chips, Sips with Meds  Diet effective midnight       Question Answer Comment  Except for Ice Chips   Except for Sips with Meds      12/30/20 1829            Code Status: Full Code    Subjective 12/31/20    Patient awake sitting up in bed when seen today.  He reports ongoing abdominal pain but controlled with medication at this time.  No fevers or chills.  Continues having diarrhea, he thinks about 15 episodes since midnight.  No other acute complaints.   Disposition Plan & Communication   Status is: Inpatient  Inpatient status is appropriate due to severity of illness with ongoing diagnostic evaluation underway as outlined above, on IV antibiotics.  Dispo: The patient is from: Home              Anticipated d/c is to: Home              Patient currently is not medically stable for discharge   Difficult to place patient no    Consults, Procedures, Significant Events   Consultants:   Gastroenterology  Procedures:   Colonoscopy planned for 3/25  Antimicrobials:  Anti-infectives (From admission, onward)   Start     Dose/Rate Route Frequency Ordered Stop   12/31/20 0630  ciprofloxacin (CIPRO) IVPB 400 mg        400 mg 200 mL/hr over 60 Minutes Intravenous Every 12 hours 12/30/20 1917     03" /24/22 0130  metroNIDAZOLE (FLAGYL) IVPB 500 mg  500 mg 100 mL/hr over 60 Minutes Intravenous Every 8 hours 12/30/20 1917     12/30/20 1730  ciprofloxacin (CIPRO) IVPB 400 mg        400 mg 200 mL/hr over 60 Minutes Intravenous  Once 12/30/20 1720 12/30/20 1955   12/30/20 1730  metroNIDAZOLE (FLAGYL) IVPB 500 mg        500 mg 100 mL/hr over 60 Minutes Intravenous  Once 12/30/20 1720 12/30/20 1954        Micro    Objective   Vitals:   12/30/20 1830 12/30/20 2152 12/31/20 0404 12/31/20 0803  BP: 133/83 (!) 138/95 124/87 (!) 128/91  Pulse: 80 89 83 84  Resp:  18 16 18   Temp:  98 F (36.7 C) 98.1 F (36.7 C) 98.5 F (36.9 C)  TempSrc:   Oral Oral  SpO2: 99% 99%  96% 98%  Weight:      Height:        Intake/Output Summary (Last 24 hours) at 12/31/2020 0820 Last data filed at 12/31/2020 0300 Gross per 24 hour  Intake 100 ml  Output --  Net 100 ml   Filed Weights   12/30/20 1521  Weight: 86.2 kg    Physical Exam:  General exam: awake, alert, no acute distress HEENT: atraumatic, clear conjunctiva, anicteric sclera, moist mucus membranes, hearing grossly normal  Respiratory system: CTAB, no wheezes, rales or rhonchi, normal respiratory effort. Cardiovascular system: normal S1/S2, RRR, no JVD, murmurs, rubs, gallops, no pedal edema.   Gastrointestinal system: soft, mild tenderness on palpation, ND, no HSM felt, +bowel sounds. Central nervous system: A&O x4. no gross focal neurologic deficits, normal speech Psychiatry: normal mood, congruent affect, judgement and insight appear normal  Labs   Data Reviewed: I have personally reviewed following labs and imaging studies  CBC: Recent Labs  Lab 12/30/20 1528 12/31/20 0604  WBC 9.4 7.3  HGB 17.0 15.7  HCT 47.2 43.4  MCV 88.4 88.2  PLT 208 172   Basic Metabolic Panel: Recent Labs  Lab 12/30/20 1528 12/31/20 0604  NA 138 139  K 4.0 3.8  CL 104 108  CO2 25 25  GLUCOSE 115* 113*  BUN 8 8  CREATININE 1.04 1.04  CALCIUM 9.0 8.4*   GFR: Estimated Creatinine Clearance: 108.6 mL/min (by C-G formula based on SCr of 1.04 mg/dL). Liver Function Tests: Recent Labs  Lab 12/30/20 1528  AST 18  ALT 29  ALKPHOS 49  BILITOT 0.9  PROT 7.6  ALBUMIN 4.5   Recent Labs  Lab 12/30/20 1528  LIPASE 27   No results for input(s): AMMONIA in the last 168 hours. Coagulation Profile: No results for input(s): INR, PROTIME in the last 168 hours. Cardiac Enzymes: No results for input(s): CKTOTAL, CKMB, CKMBINDEX, TROPONINI in the last 168 hours. BNP (last 3 results) No results for input(s): PROBNP in the last 8760 hours. HbA1C: No results for input(s): HGBA1C in the last 72 hours. CBG: No  results for input(s): GLUCAP in the last 168 hours. Lipid Profile: No results for input(s): CHOL, HDL, LDLCALC, TRIG, CHOLHDL, LDLDIRECT in the last 72 hours. Thyroid Function Tests: No results for input(s): TSH, T4TOTAL, FREET4, T3FREE, THYROIDAB in the last 72 hours. Anemia Panel: No results for input(s): VITAMINB12, FOLATE, FERRITIN, TIBC, IRON, RETICCTPCT in the last 72 hours. Sepsis Labs: No results for input(s): PROCALCITON, LATICACIDVEN in the last 168 hours.  Recent Results (from the past 240 hour(s))  Resp Panel by RT-PCR (Flu A&B, Covid) Nasopharyngeal Swab  Status: None   Collection Time: 12/30/20  5:35 PM   Specimen: Nasopharyngeal Swab; Nasopharyngeal(NP) swabs in vial transport medium  Result Value Ref Range Status   SARS Coronavirus 2 by RT PCR NEGATIVE NEGATIVE Final    Comment: (NOTE) SARS-CoV-2 target nucleic acids are NOT DETECTED.  The SARS-CoV-2 RNA is generally detectable in upper respiratory specimens during the acute phase of infection. The lowest concentration of SARS-CoV-2 viral copies this assay can detect is 138 copies/mL. A negative result does not preclude SARS-Cov-2 infection and should not be used as the sole basis for treatment or other patient management decisions. A negative result may occur with  improper specimen collection/handling, submission of specimen other than nasopharyngeal swab, presence of viral mutation(s) within the areas targeted by this assay, and inadequate number of viral copies(<138 copies/mL). A negative result must be combined with clinical observations, patient history, and epidemiological information. The expected result is Negative.  Fact Sheet for Patients:  BloggerCourse.com  Fact Sheet for Healthcare Providers:  SeriousBroker.it  This test is no t yet approved or cleared by the [place] FDA and  has been authorized for detection and/or diagnosis of SARS-CoV-2  by FDA under an Emergency Use Authorization (EUA). This EUA will remain  in effect (meaning this test can be used) for the duration of the COVID-19 declaration under Section 564(b)(1) of the Act, 21 U.S.C.section 360bbb-3(b)(1), unless the authorization is terminated  or revoked sooner.       Influenza A by PCR NEGATIVE NEGATIVE Final   Influenza B by PCR NEGATIVE NEGATIVE Final    Comment: (NOTE) The Xpert Xpress SARS-CoV-2/FLU/RSV plus assay is intended as an aid in the diagnosis of influenza from Nasopharyngeal swab specimens and should not be used as a sole basis for treatment. Nasal washings and aspirates are unacceptable for Xpert Xpress SARS-CoV-2/FLU/RSV testing.  Fact Sheet for Patients: BloggerCourse.com  Fact Sheet for Healthcare Providers: SeriousBroker.it  This test is not yet approved or cleared by the [place] FDA and has been authorized for detection and/or diagnosis of SARS-CoV-2 by FDA under an Emergency Use Authorization (EUA). This EUA will remain in effect (meaning this test can be used) for the duration of the COVID-19 declaration under Section 564(b)(1) of the Act, 21 U.S.C. section 360bbb-3(b)(1), unless the authorization is terminated or revoked.  Performed at [facility], [address] [place], [place] [address]   C Difficile Quick Screen w PCR reflex     Status: None   Collection Time: 12/30/20  5:35 PM   Specimen: STOOL  Result Value Ref Range Status   C Diff antigen NEGATIVE NEGATIVE Final   C Diff toxin NEGATIVE NEGATIVE Final   C Diff interpretation No C. difficile detected.  Final    Comment: Performed at [facility], [address] [place], [place] [address]  Gastrointestinal Panel by PCR , Stool     Status: None   Collection Time: 12/30/20  5:35 PM   Specimen: STOOL  Result Value Ref Range Status   Campylobacter species NOT DETECTED NOT DETECTED Final    Plesimonas shigelloides NOT DETECTED NOT DETECTED Final   Salmonella species NOT DETECTED NOT DETECTED Final   Yersinia enterocolitica NOT DETECTED NOT DETECTED Final   Vibrio species NOT DETECTED NOT DETECTED Final   Vibrio cholerae NOT DETECTED NOT DETECTED Final   Enteroaggregative E coli (EAEC) NOT DETECTED NOT DETECTED Final   Enteropathogenic E coli (EPEC) NOT DETECTED NOT DETECTED Final   Enterotoxigenic E coli (ETEC)  NOT DETECTED NOT DETECTED Final   Shiga like toxin producing E coli (STEC) NOT DETECTED NOT DETECTED Final   Shigella/Enteroinvasive E coli (EIEC) NOT DETECTED NOT DETECTED Final   Cryptosporidium NOT DETECTED NOT DETECTED Final   Cyclospora cayetanensis NOT DETECTED NOT DETECTED Final   Entamoeba histolytica NOT DETECTED NOT DETECTED Final   Giardia lamblia NOT DETECTED NOT DETECTED Final   Adenovirus F40/41 NOT DETECTED NOT DETECTED Final   Astrovirus NOT DETECTED NOT DETECTED Final   Norovirus GI/GII NOT DETECTED NOT DETECTED Final   Rotavirus A NOT DETECTED NOT DETECTED Final   Sapovirus (I, II, IV, and V) NOT DETECTED NOT DETECTED Final    Comment: Performed at [facility], [address], [place], [place] [address]      Imaging Studies   CT Renal Soundra Pilon  Addendum Date: 12/30/2020   ADDENDUM REPORT: 12/30/2020 16:54 ADDENDUM: These results were called by telephone at the time of interpretation on 12/30/2020 at 4:54 pm to provider [facility] , who verbally acknowledged these results. Electronically Signed   By: Donzetta Kohut M.D.   On: 12/30/2020 16:54   Result Date: 12/30/2020 CLINICAL DATA:  Flank pain, suspected kidney stone in a 35 year old male with LEFT lower quadrant abdominal pain and constipation for several days. EXAM: CT ABDOMEN AND PELVIS WITHOUT CONTRAST TECHNIQUE: Multidetector CT imaging of the abdomen and pelvis was performed following the standard protocol without IV contrast. COMPARISON:  None FINDINGS: Lower chest: Lung  bases are clear. Hepatobiliary: Severe hepatic steatosis. Liver contour is smooth. Cholelithiasis without pericholecystic stranding. Pancreas: Change in density of the pancreas towards the splenic hilum. When viewed in the coronal plane area of more confluent soft tissue measuring approximately 1.8 cm, potentially variant parenchyma in this location though difficult to exclude underlying lesion. No peripancreatic stranding. Spleen: Smooth contour of the spleen.  No focal lesion. Adrenals/Urinary Tract: Adrenal glands are normal. Smooth renal contours. Urinary bladder under distended. No nephrolithiasis. No ureteral calculi. No perinephric stranding. No hydronephrosis. Small LEFT extrarenal pelvis. Stomach/Bowel: Stomach and small bowel without acute process proximally. Mural stratification of the mid to distal ileum. No significant perienteric stranding. No sign of bowel obstruction. Appendix is normal. Beginning in the ascending colon there is profound colonic thickening and moderate pericolonic stranding. This extends through the sigmoid colon and potentially into the rectum no pneumatosis. Vascular/Lymphatic: Normal caliber abdominal aorta. Smooth contour the IVC. The There is no gastrohepatic or hepatoduodenal ligament lymphadenopathy. No retroperitoneal or mesenteric lymphadenopathy. No pelvic sidewall lymphadenopathy. Reproductive: Prostate unremarkable by CT. Other: No ascites. Musculoskeletal: No acute musculoskeletal process. IMPRESSION: 1. Profound colonic thickening and moderate pericolonic stranding. This extends through the sigmoid colon and potentially into the rectum. Findings are suspicious for colitis, more likely infectious or inflammatory. Given the distribution of involvement would suggest correlation with any recent antibiotic therapy and exclusion of process such as C difficile colitis. Presence of potential ongoing or prior enteritis would support the possibility of inflammatory bowel disease  but is nonspecific. 2. Severe hepatic steatosis. 3. Cholelithiasis without evidence of acute cholecystitis. 4. Change in density of the pancreas towards the splenic hilum. When viewed in the coronal plane area of more confluent soft tissue in this location makes it difficult to exclude underlying lesion. Consider follow-up with contrasted CT or MRI. This does not appear to represent an acute process. Given the possibility of underlying lesion prompt follow-up is however suggested. This could be performed as an outpatient particularly if MRI is considered as MRI  is often of better quality outside of the acute setting. Electronically Signed: By: Donzetta Kohut M.D. On: 12/30/2020 16:48     Medications   Scheduled Meds: . amphetamine-dextroamphetamine  15 mg Oral q morning  . buPROPion  450 mg Oral Daily  . enoxaparin (LOVENOX) injection  40 mg Subcutaneous Q24H  . nicotine  14 mg Transdermal Daily  . pantoprazole  40 mg Oral Daily   Continuous Infusions: . ciprofloxacin 400 mg (12/31/20 8119)  . metronidazole 500 mg (12/31/20 0122)       LOS: 0 days    Time spent: 30 minutes    Pennie Banter, DO [facility]  12/31/2020, 8:20 AM      If 7PM-7AM, please contact night-coverage. How to contact the [facility] Attending or Consulting provider 7A - 7P or covering provider during after hours 7P -7A, for this patient?    1. Check the care team in [facility] and look for a) attending/consulting TR[facility] provider listed and b) the [facility] team listed 2. Log into [email or web address] and use [place]'s universal password to access. If you do not have the password, please contact the hospital operator. 3. Locate the [facility] provider you are looking for under [facility] and page to a number that you can be directly reached. 4. If you still have difficulty reaching the provider, please page the [facility] (Director on Call) for the Hospitalists listed on amion for assistance.

## 2021-01-01 ENCOUNTER — Inpatient Hospital Stay: Payer: BC Managed Care – PPO | Admitting: Anesthesiology

## 2021-01-01 ENCOUNTER — Encounter: Payer: Self-pay | Admitting: Internal Medicine

## 2021-01-01 ENCOUNTER — Encounter
Admission: EM | Disposition: A | Discharge status: Home / Self Care | Payer: Self-pay | Source: Home / Self Care | Attending: Emergency Medicine

## 2021-01-01 DIAGNOSIS — K5100 Ulcerative (chronic) pancolitis without complications: Secondary | ICD-10-CM | POA: Diagnosis not present

## 2021-01-01 DIAGNOSIS — K529 Noninfective gastroenteritis and colitis, unspecified: Secondary | ICD-10-CM | POA: Diagnosis not present

## 2021-01-01 HISTORY — PX: COLONOSCOPY WITH PROPOFOL: SHX5780

## 2021-01-01 LAB — HEPATITIS B CORE ANTIBODY, TOTAL: Hep B Core Total Ab: NONREACTIVE

## 2021-01-01 LAB — HEPATITIS B SURFACE ANTIGEN: Hepatitis B Surface Ag: NONREACTIVE

## 2021-01-01 LAB — HEPATITIS A ANTIBODY, TOTAL: hep A Total Ab: NONREACTIVE

## 2021-01-01 SURGERY — COLONOSCOPY WITH PROPOFOL
Anesthesia: General

## 2021-01-01 MED ORDER — PROPOFOL 500 MG/50ML IV EMUL
INTRAVENOUS | Status: DC | PRN
  Administered 2021-01-01: 200 ug/kg/min via INTRAVENOUS

## 2021-01-01 MED ORDER — MAGNESIUM CITRATE PO SOLN
1.0000 | Freq: Once | ORAL | Status: AC
  Administered 2021-01-01: 1 via ORAL
  Filled 2021-01-01: qty 296

## 2021-01-01 MED ORDER — PREDNISONE 20 MG PO TABS: 40.0000 mg | ORAL_TABLET | Freq: Every day | ORAL | 1 refills | Status: DC

## 2021-01-01 MED ORDER — ONDANSETRON HCL 8 MG PO TABS: 8.0000 mg | ORAL_TABLET | Freq: Three times a day (TID) | ORAL | 0 refills | Status: DC | PRN

## 2021-01-01 MED ORDER — ONDANSETRON 8 MG PO TBDP: 8.0000 mg | ORAL_TABLET | Freq: Three times a day (TID) | ORAL | 0 refills | Status: DC | PRN

## 2021-01-01 MED ORDER — GERHARDT'S BUTT CREAM
TOPICAL_CREAM | CUTANEOUS | Status: DC | PRN
  Filled 2021-01-01: qty 1

## 2021-01-01 MED ORDER — PREDNISONE 20 MG PO TABS
40.0000 mg | ORAL_TABLET | Freq: Every day | ORAL | Status: DC
  Administered 2021-01-01: 40 mg via ORAL
  Filled 2021-01-01: qty 2

## 2021-01-01 MED ORDER — PROPOFOL 10 MG/ML IV BOLUS
INTRAVENOUS | Status: DC | PRN
  Administered 2021-01-01 (×2): 30 mg via INTRAVENOUS
  Administered 2021-01-01: 100 mg via INTRAVENOUS

## 2021-01-01 NOTE — Op Note (Signed)
[facility] Gastroenterology Patient Name: Brent [name] Procedure Date: 01/01/2021 10:41 AM MRN: [id number] Account #: [id number] Date of Birth: 27-Aug-1986 Admit Type: Outpatient Age: 35 [facility] END[facility] ROOM 1 Gender: Male Note Status: Finalized Procedure:             Colonoscopy Indications:           This is the patient's first colonoscopy, Abdominal                         pain in the left lower quadrant, Abnormal CT of the GI                         tract Providers:             Toney Reilohini Reddy Vanga MD, MD Referring MD:          [facility] Medicines:             General Anesthesia Complications:         No immediate complications. Estimated blood loss:                         Minimal. Procedure:             Pre-Anesthesia Assessment:                        - Prior to the procedure, a History and Physical was                         performed, and patient medications and allergies were                         reviewed. The patient is competent. The risks and                         benefits of the procedure and the sedation options and                         risks were discussed with the patient. All questions                         were answered and informed consent was obtained.                         Patient identification and proposed procedure were                         verified by the physician, the nurse, the                         anesthesiologist, the anesthetist and the technician                         in the pre-procedure area in the procedure room in the                         endoscopy suite. Mental Status Examination: alert and  oriented. Airway Examination: normal oropharyngeal                         airway and neck mobility. Respiratory Examination:                         clear to auscultation. CV Examination: normal.                         Prophylactic Antibiotics: The patient does not  require                         prophylactic antibiotics. Prior Anticoagulants: The                         patient has taken no previous anticoagulant or                         antiplatelet agents. ASA Grade Assessment: II - A                         patient with mild systemic disease. After reviewing                         the risks and benefits, the patient was deemed in                         satisfactory condition to undergo the procedure. The                         anesthesia plan was to use general anesthesia.                         Immediately prior to administration of medications,                         the patient was re-assessed for adequacy to receive                         sedatives. The heart rate, respiratory rate, oxygen                         saturations, blood pressure, adequacy of pulmonary                         ventilation, and response to care were monitored                         throughout the procedure. The physical status of the                         patient was re-assessed after the procedure.                        After obtaining informed consent, the colonoscope was                         passed under direct vision. Throughout the procedure,  the patient's blood pressure, pulse, and oxygen                         saturations were monitored continuously. The                         Colonoscope was introduced through the anus and                         advanced to the the terminal ileum, with                         identification of the appendiceal orifice and IC                         valve. The colonoscopy was performed without                         difficulty. The patient tolerated the procedure well.                         The quality of the bowel preparation was evaluated                         using the BBPS [facility] Bowel Preparation Scale) with                         scores of: Right Colon = 3, Transverse Colon  = 3 and                         Left Colon = 3 (entire mucosa seen well with no                         residual staining, small fragments of stool or opaque                         liquid). The total BBPS score equals 9. Findings:      The perianal and digital rectal examinations were normal. Pertinent       negatives include normal sphincter tone and no palpable rectal lesions.      The terminal ileum appeared normal. Biopsies were taken with a cold       forceps for histology.      Inflammation was found in a continuous and circumferential pattern from       the anus to the cecum. This was graded as Mayo Score 2-3 (severe, with       spontaneous bleeding, ulcerations), and when compared to the previous       examination, the findings are new. Biopsies were taken with a cold       forceps for histology. Estimated blood loss was minimal. Impression:            - The examined portion of the ileum was normal.                         Biopsied.                        - Severe (Mayo Score 3) pancolitis ulcerative  colitis,                         new since the last examination. Biopsied. Recommendation:        - Return patient to hospital ward for ongoing care.                        - Advance diet as tolerated today.                        - Continue present medications.                        - Await pathology results.                        - Recommend to stop antibiotics                        - Start prednisone 40mg  daily                        - Return to my office in 2 weeks. Procedure Code(s):     --- Professional ---                        [phone], Colonoscopy, flexible; with biopsy, single or                         multiple Diagnosis Code(s):     --- Professional ---                        K51.00, Ulcerative (chronic) pancolitis without                         complications                        R10.32, Left lower quadrant pain                        R93.3, Abnormal findings on  diagnostic imaging of                         other parts of digestive tract CPT copyright 2019 American Medical Association. All rights reserved. The codes documented in this report are preliminary and upon coder review may  be revised to meet current compliance requirements. Dr. 2020 Libby Maw MD, MD 01/01/2021 11:20:26 AM This report has been signed electronically. Number of Addenda: 0 Note Initiated On: 01/01/2021 10:41 AM Scope Withdrawal Time: 0 hours 13 minutes 11 seconds  Total Procedure Duration: 0 hours 16 minutes 8 seconds  Estimated Blood Loss:  Estimated blood loss was minimal.      [facility]

## 2021-01-01 NOTE — Progress Notes (Signed)
Pt's PIV removed with tip intact. Discharge instructions explained with pt. Pt denies any questions or concerns. Pt to pick up meds from home pharmacy. Pt's mother at bedside and will take pt home.

## 2021-01-01 NOTE — Discharge Instructions (Signed)
Food Choices to Help Relieve Diarrhea, Adult Diarrhea can make you feel weak and cause you to become dehydrated. It is important to choose the right foods and drinks to:  Relieve diarrhea.  Replace lost fluids and nutrients.  Prevent dehydration. What are tips for following this plan? Relieving diarrhea  Avoid foods that make your diarrhea worse. These may include: ? Foods and beverages sweetened with high-fructose corn syrup, honey, or sweeteners such as xylitol, sorbitol, and mannitol. ? Fried, greasy, or spicy foods. ? Raw fruits and vegetables.  Eat foods that are rich in probiotics. These include foods such as yogurt and fermented milk products. Probiotics can help increase healthy bacteria in your stomach and intestines (gastrointestinal tract or GI tract). This may help digestion and stop diarrhea.  If you have lactose intolerance, avoid dairy products. These may make your diarrhea worse.  Take medicine to help stop diarrhea only as told by your health care provider. Replacing nutrients  Eat bland, easy-to-digest foods in small amounts as you are able, until your diarrhea starts to get better. These foods include bananas, applesauce, rice, toast, and crackers.  Gradually reintroduce nutrient-rich foods as tolerated or as told by your health care provider. This includes: ? Well-cooked protein foods, such as eggs, lean meats like fish or chicken without skin, and tofu. ? Peeled, seeded, and soft-cooked fruits and vegetables. ? Low-fat dairy products. ? Whole grains.  Take vitamin and mineral supplements as told by your health care provider.   Preventing dehydration  Start by sipping water or a solution to prevent dehydration (oral rehydration solution, ORS). This is a drink that helps replace fluids and minerals your body has lost. You can buy an ORS at pharmacies and retail stores.  Try to drink at least 8-10 cups (2,000-2,500 mL) of fluid each day to help replace lost  fluids. If you have urine that is pale yellow, you are getting enough fluids.  You may drink other liquids in addition to water, such as fruit juice that you have added water to (diluted fruit juice) or low-calorie sports drinks, as tolerated or as told by your health care provider.  Avoid drinks with caffeine, such as coffee, tea, or soft drinks.  Avoid alcohol.   Summary  When you have diarrhea, it is important to choose the right foods and drinks to relieve diarrhea, to replace lost fluids and nutrients, and to prevent dehydration.  Make sure you drink enough fluid to keep your urine pale yellow.  You may benefit from eating bland foods at first. Gradually reintroduce healthy, nutrient-rich foods as tolerated or as told by your health care provider.  Avoid foods that make your diarrhea worse, such as fried, greasy, or spicy foods. This information is not intended to replace advice given to you by your health care provider. Make sure you discuss any questions you have with your health care provider. Document Revised: 11/12/2019 Document Reviewed: 11/12/2019 [employer]vier Patient Education  2021 [employer]  

## 2021-01-01 NOTE — Transfer of Care (Signed)
Immediate Anesthesia Transfer of Care Note  Patient: Brent [name]  Procedure(s) Performed: COLONOSCOPY WITH PROPOFOL (N/A )  Patient Location: PACU  Anesthesia Type:General  Level of Consciousness: sedated  Airway & Oxygen Therapy: Patient Spontanous Breathing and Patient connected to nasal cannula oxygen  Post-op Assessment: Report given to RN and Post -op Vital signs reviewed and stable  Post vital signs: Reviewed and stable  Last Vitals:  Vitals Value Taken Time  BP 131/92 01/01/21 1120  Temp    Pulse 99 01/01/21 1121  Resp 20 01/01/21 1121  SpO2 97 % 01/01/21 1121  Vitals shown include unvalidated device data.  Last Pain:  Vitals:   01/01/21 1120  TempSrc:   PainSc: 0-No pain      Patients Stated Pain Goal: 0 (12/30/20 2245)  Complications: No complications documented.

## 2021-01-01 NOTE — Anesthesia Postprocedure Evaluation (Signed)
Anesthesia Post Note  Patient: Brent [name]  Procedure(s) Performed: COLONOSCOPY WITH PROPOFOL (N/A )  Patient location during evaluation: Endoscopy Anesthesia Type: General Level of consciousness: awake and alert Pain management: pain level controlled Vital Signs Assessment: post-procedure vital signs reviewed and stable Respiratory status: spontaneous breathing, nonlabored ventilation, respiratory function stable and patient connected to nasal cannula oxygen Cardiovascular status: blood pressure returned to baseline and stable Postop Assessment: no apparent nausea or vomiting Anesthetic complications: no   No complications documented.   Last Vitals:  Vitals:   01/01/21 1120 01/01/21 1240  BP:  126/82  Pulse:  79  Resp:  18  Temp: 36.4 C (!) 36.4 C  SpO2:  100%    Last Pain:  Vitals:   01/01/21 1240  TempSrc: Oral  PainSc:                  Lenard Simmer

## 2021-01-01 NOTE — Anesthesia Procedure Notes (Signed)
Date/Time: 01/01/2021 11:05 AM Performed by: Junious Silk, CRNA Pre-anesthesia Checklist: Patient identified, Emergency Drugs available, Suction available, Patient being monitored and Timeout performed Oxygen Delivery Method: Nasal cannula

## 2021-01-01 NOTE — Anesthesia Preprocedure Evaluation (Signed)
Anesthesia Evaluation  Patient identified by MRN, date of birth, ID band Patient awake    Reviewed: Allergy & Precautions, H&P , NPO status , Patient's Chart, lab work & pertinent test results, reviewed documented beta blocker date and time   History of Anesthesia Complications Negative for: history of anesthetic complications  Airway Mallampati: I  TM Distance: >3 FB Neck ROM: full    Dental  (+) Dental Advidsory Given, Caps, Teeth Intact, Chipped, Missing   Pulmonary neg shortness of breath, neg COPD, neg recent URI, Current Smoker (Vape),    Pulmonary exam normal breath sounds clear to auscultation       Cardiovascular Exercise Tolerance: Good negative cardio ROS Normal cardiovascular exam Rhythm:regular Rate:Normal     Neuro/Psych negative neurological ROS  negative psych ROS   GI/Hepatic Neg liver ROS, GERD  ,  Endo/Other  negative endocrine ROS  Renal/GU negative Renal ROS  negative genitourinary   Musculoskeletal   Abdominal   Peds  Hematology negative hematology ROS (+)   Anesthesia Other Findings History reviewed. No pertinent past medical history.   Reproductive/Obstetrics negative OB ROS                             Anesthesia Physical Anesthesia Plan  ASA: II  Anesthesia Plan: General   Post-op Pain Management:    Induction: Intravenous  PONV Risk Score and Plan: 3 and TIVA and Propofol infusion  Airway Management Planned: Natural Airway and Nasal Cannula  Additional Equipment:   Intra-op Plan:   Post-operative Plan:   Informed Consent: I have reviewed the patients History and Physical, chart, labs and discussed the procedure including the risks, benefits and alternatives for the proposed anesthesia with the patient or authorized representative who has indicated his/her understanding and acceptance.     Dental Advisory Given  Plan Discussed with:  Anesthesiologist, CRNA and Surgeon  Anesthesia Plan Comments:         Anesthesia Quick Evaluation

## 2021-01-01 NOTE — Discharge Summary (Signed)
Physician Discharge Summary  Brent [name] ZOX:[id number] DOB: 07-19-1986 DOA: 12/30/2020  PCP: [facility]  Admit date: 12/30/2020 Discharge date: 01/01/2021  Admitted From: home Disposition:  home  Recommendations for Outpatient Follow-up:  1. Follow up with PCP in 1-2 weeks 2. Please obtain BMP/CBC in one week 3. Please follow with GI in 2-3 weeks  Home Health: no  Equipment/Devices: none   Discharge Condition: stable  CODE STATUS: full  Diet recommendation: regular as tolerated   Discharge Diagnoses: Principal Problem:   Colitis Active Problems:   Pancreatic lesion   Adult ADHD   Tobacco abuse   Depression    Summary of HPI and Hospital Course:  35 y.o. male with medical history significant for depression, ADHD, presents to the emergency department for chief concerns of LLQ abdominal pain since Sunday 3/20, intolerance to oral intake since 3/22 due to pain, loose non-bloody stools.  Denied N/V.  Notably, pt's sister recently diagnosed with ulcerative colitis.  CT abd/pelvis renal stone protocol - Beginning in the ascending colon there is profound colonic thickening and moderate pericolonic stranding. This extends through the sigmoid colon and potentially into the rectum no pneumatosis.  Admitted for further evaluation and management of infectious vs inflammatory colitis.  C diff and GI pathogen panel were negative.  Started on empiric antibiotics, Cipro and Flagyl.    Ulcerative Colitis with flare and pancolitis -present on mission with CT findings as outlined above.  C. difficile negative. Family history of IBD, sister has ulcerative colitis. Colonoscopy showed severe ulcerative colitis. Started on prednisone 40 mg daily. Pain improved, having some mild nausea after eating. Follow up with GI in 2-3 weeks to discuss biologic therapies.0 Treated with empiric Cipro and Flagyl  ?  Pancreatic lesion -see CT report.   GI recommends MRI  pancreas protocol as outpatient.    ADHD -continue Adderall  Depression -stable.  Continue Wellbutrin  Tobacco abuse -nicotine patch ordered.   Counseled regarding importance of cessation.   Discharge Instructions   Discharge Instructions    Call MD for:  extreme fatigue   Complete by: As directed    Call MD for:  persistant dizziness or light-headedness   Complete by: As directed    Call MD for:  persistant nausea and vomiting   Complete by: As directed    Call MD for:  severe uncontrolled pain   Complete by: As directed    Call MD for:  temperature >100.4   Complete by: As directed    Diet - low sodium heart healthy   Complete by: As directed    Discharge instructions   Complete by: As directed    Take Prednisone daily with  a meal.  Zofran (aka ondansetron) is for nausea.  The "ODT" ones dissolve in your mouth, use those if you are vomiting or feeling like you might vomit.  If just nausea without vomiting, use the pills you swallow.  Follow up with GI in their clinic in 1-2 weeks.   Increase activity slowly   Complete by: As directed      Allergies as of 01/01/2021      Reactions   Wasp Venom Protein Shortness Of Breath      Medication List    TAKE these medications   amphetamine-dextroamphetamine 15 MG 24 hr capsule Commonly known as: ADDERALL XR Take 15 mg by mouth every morning.   buPROPion 150 MG 24 hr tablet Commonly known as: WELLBUTRIN XL Take 450 mg by mouth daily.  calcium carbonate 500 MG chewable tablet Commonly known as: TUMS - dosed in mg elemental calcium Chew 1 tablet by mouth daily.   omeprazole 20 MG capsule Commonly known as: PRILOSEC Take 20 mg by mouth daily.   ondansetron 8 MG disintegrating tablet Commonly known as: Zofran ODT Take 1 tablet (8 mg total) by mouth every 8 (eight) hours as needed for nausea or vomiting.   ondansetron 8 MG tablet Commonly known as: Zofran Take 1 tablet (8 mg total) by mouth every 8 (eight)  hours as needed for nausea or vomiting.   predniSONE 20 MG tablet Commonly known as: DELTASONE Take 2 tablets (40 mg total) by mouth daily with breakfast.       Follow-up Information    Vanga, Loel Dubonnet, MD. Schedule an appointment as soon as possible for a visit in 2 week(s).   Specialty: Gastroenterology Why: Follow up in 2-3 weeks Contact information: [address] [place] [address] [phone]        [facility]. Schedule an appointment as soon as possible for a visit in 1 week(s).   Why: Hospital follow up Contact information: [address] [place] [address] [phone]              Allergies  Allergen Reactions  . Wasp Venom Protein Shortness Of Breath     If you experience worsening of your admission symptoms, develop shortness of breath, life threatening emergency, suicidal or homicidal thoughts you must seek medical attention immediately by calling 911 or calling your MD immediately  if symptoms less severe.    Please note   You were cared for by a hospitalist during your hospital stay. If you have any questions about your discharge medications or the care you received while you were in the hospital after you are discharged, you can call the unit and asked to speak with the hospitalist on call if the hospitalist that took care of you is not available. Once you are discharged, your primary care physician will handle any further medical issues. Please note that NO REFILLS for any discharge medications will be authorized once you are discharged, as it is imperative that you return to your primary care physician (or establish a relationship with a primary care physician if you do not have one) for your aftercare needs so that they can reassess your need for medications and monitor your lab values.   Consultations:  GI   Procedures/Studies: CT Renal Stone Study  Addendum Date: 12/30/2020   ADDENDUM REPORT:  12/30/2020 16:54 ADDENDUM: These results were called by telephone at the time of interpretation on 12/30/2020 at 4:54 pm to provider [facility] , who verbally acknowledged these results. Electronically Signed   By: Donzetta Kohut M.D.   On: 12/30/2020 16:54   Result Date: 12/30/2020 CLINICAL DATA:  Flank pain, suspected kidney stone in a 35 year old male with LEFT lower quadrant abdominal pain and constipation for several days. EXAM: CT ABDOMEN AND PELVIS WITHOUT CONTRAST TECHNIQUE: Multidetector CT imaging of the abdomen and pelvis was performed following the standard protocol without IV contrast. COMPARISON:  None FINDINGS: Lower chest: Lung bases are clear. Hepatobiliary: Severe hepatic steatosis. Liver contour is smooth. Cholelithiasis without pericholecystic stranding. Pancreas: Change in density of the pancreas towards the splenic hilum. When viewed in the coronal plane area of more confluent soft tissue measuring approximately 1.8 cm, potentially variant parenchyma in this location though difficult to exclude underlying lesion. No peripancreatic stranding.  Spleen: Smooth contour of the spleen.  No focal lesion. Adrenals/Urinary Tract: Adrenal glands are normal. Smooth renal contours. Urinary bladder under distended. No nephrolithiasis. No ureteral calculi. No perinephric stranding. No hydronephrosis. Small LEFT extrarenal pelvis. Stomach/Bowel: Stomach and small bowel without acute process proximally. Mural stratification of the mid to distal ileum. No significant perienteric stranding. No sign of bowel obstruction. Appendix is normal. Beginning in the ascending colon there is profound colonic thickening and moderate pericolonic stranding. This extends through the sigmoid colon and potentially into the rectum no pneumatosis. Vascular/Lymphatic: Normal caliber abdominal aorta. Smooth contour the IVC. The There is no gastrohepatic or hepatoduodenal ligament lymphadenopathy. No retroperitoneal or  mesenteric lymphadenopathy. No pelvic sidewall lymphadenopathy. Reproductive: Prostate unremarkable by CT. Other: No ascites. Musculoskeletal: No acute musculoskeletal process. IMPRESSION: 1. Profound colonic thickening and moderate pericolonic stranding. This extends through the sigmoid colon and potentially into the rectum. Findings are suspicious for colitis, more likely infectious or inflammatory. Given the distribution of involvement would suggest correlation with any recent antibiotic therapy and exclusion of process such as C difficile colitis. Presence of potential ongoing or prior enteritis would support the possibility of inflammatory bowel disease but is nonspecific. 2. Severe hepatic steatosis. 3. Cholelithiasis without evidence of acute cholecystitis. 4. Change in density of the pancreas towards the splenic hilum. When viewed in the coronal plane area of more confluent soft tissue in this location makes it difficult to exclude underlying lesion. Consider follow-up with contrasted CT or MRI. This does not appear to represent an acute process. Given the possibility of underlying lesion prompt follow-up is however suggested. This could be performed as an outpatient particularly if MRI is considered as MRI is often of better quality outside of the acute setting. Electronically Signed: By: Donzetta Kohut M.D. On: 12/30/2020 16:48      Colonoscopy   Subjective: Pt feeling somewhat better today.  No diarrhea.  Tolerating diet.  Still having bouts of abdominal pain and nausea.  Wants to go home.     Discharge Exam: Vitals:   01/01/21 1120 01/01/21 1240  BP:  126/82  Pulse:  79  Resp:  18  Temp: 97.6 F (36.4 C) (!) 97.5 F (36.4 C)  SpO2:  100%   Vitals:   01/01/21 0912 01/01/21 1006 01/01/21 1120 01/01/21 1240  BP: 125/85 125/81  126/82  Pulse: 82 86  79  Resp: 18 20  18   Temp: 98 F (36.7 C) 97.8 F (36.6 C) 97.6 F (36.4 C) (!) 97.5 F (36.4 C)  TempSrc: Oral Temporal Temporal  Oral  SpO2:  97%  100%  Weight:      Height:        General: Pt is alert, awake, not in acute distress Cardiovascular: RRR, S1/S2 +, no rubs, no gallops Respiratory: CTA bilaterally, no wheezing, no rhonchi Abdominal: Soft, mildly tender on palpation without guarding or rebound tenderness, ND, bowel sounds + Extremities: no edema, no cyanosis    The results of significant diagnostics from this hospitalization (including imaging, microbiology, ancillary and laboratory) are listed below for reference.     Microbiology: Recent Results (from the past 240 hour(s))  Resp Panel by RT-PCR (Flu A&B, Covid) Nasopharyngeal Swab     Status: None   Collection Time: 12/30/20  5:35 PM   Specimen: Nasopharyngeal Swab; Nasopharyngeal(NP) swabs in vial transport medium  Result Value Ref Range Status   SARS Coronavirus 2 by RT PCR NEGATIVE NEGATIVE Final    Comment: (NOTE) SARS-CoV-2 target nucleic acids  are NOT DETECTED.  The SARS-CoV-2 RNA is generally detectable in upper respiratory specimens during the acute phase of infection. The lowest concentration of SARS-CoV-2 viral copies this assay can detect is 138 copies/mL. A negative result does not preclude SARS-Cov-2 infection and should not be used as the sole basis for treatment or other patient management decisions. A negative result may occur with  improper specimen collection/handling, submission of specimen other than nasopharyngeal swab, presence of viral mutation(s) within the areas targeted by this assay, and inadequate number of viral copies(<138 copies/mL). A negative result must be combined with clinical observations, patient history, and epidemiological information. The expected result is Negative.  Fact Sheet for Patients:  BloggerCourse.comhttps://www.fda.gov/media/152166/download  Fact Sheet for Healthcare Providers:  SeriousBroker.ithttps://www.fda.gov/media/152162/download  This test is no t yet approved or cleared by the [place] FDA and  has  been authorized for detection and/or diagnosis of SARS-CoV-2 by FDA under an Emergency Use Authorization (EUA). This EUA will remain  in effect (meaning this test can be used) for the duration of the COVID-19 declaration under Section 564(b)(1) of the Act, 21 U.S.C.section 360bbb-3(b)(1), unless the authorization is terminated  or revoked sooner.       Influenza A by PCR NEGATIVE NEGATIVE Final   Influenza B by PCR NEGATIVE NEGATIVE Final    Comment: (NOTE) The Xpert Xpress SARS-CoV-2/FLU/RSV plus assay is intended as an aid in the diagnosis of influenza from Nasopharyngeal swab specimens and should not be used as a sole basis for treatment. Nasal washings and aspirates are unacceptable for Xpert Xpress SARS-CoV-2/FLU/RSV testing.  Fact Sheet for Patients: BloggerCourse.comhttps://www.fda.gov/media/152166/download  Fact Sheet for Healthcare Providers: SeriousBroker.ithttps://www.fda.gov/media/152162/download  This test is not yet approved or cleared by the [place] FDA and has been authorized for detection and/or diagnosis of SARS-CoV-2 by FDA under an Emergency Use Authorization (EUA). This EUA will remain in effect (meaning this test can be used) for the duration of the COVID-19 declaration under Section 564(b)(1) of the Act, 21 U.S.C. section 360bbb-3(b)(1), unless the authorization is terminated or revoked.  Performed at [facility], [address] [place], [place] [address]   C Difficile Quick Screen w PCR reflex     Status: None   Collection Time: 12/30/20  5:35 PM   Specimen: STOOL  Result Value Ref Range Status   C Diff antigen NEGATIVE NEGATIVE Final   C Diff toxin NEGATIVE NEGATIVE Final   C Diff interpretation No C. difficile detected.  Final    Comment: Performed at [facility], [address] [place], [place] [address]  Gastrointestinal Panel by PCR , Stool     Status: None   Collection Time: 12/30/20  5:35 PM   Specimen: STOOL  Result Value Ref Range Status    Campylobacter species NOT DETECTED NOT DETECTED Final   Plesimonas shigelloides NOT DETECTED NOT DETECTED Final   Salmonella species NOT DETECTED NOT DETECTED Final   Yersinia enterocolitica NOT DETECTED NOT DETECTED Final   Vibrio species NOT DETECTED NOT DETECTED Final   Vibrio cholerae NOT DETECTED NOT DETECTED Final   Enteroaggregative E coli (EAEC) NOT DETECTED NOT DETECTED Final   Enteropathogenic E coli (EPEC) NOT DETECTED NOT DETECTED Final   Enterotoxigenic E coli (ETEC) NOT DETECTED NOT DETECTED Final   Shiga like toxin producing E coli (STEC) NOT DETECTED NOT DETECTED Final   Shigella/Enteroinvasive E coli (EIEC) NOT DETECTED NOT DETECTED Final   Cryptosporidium NOT DETECTED NOT DETECTED Final   Cyclospora cayetanensis NOT DETECTED NOT DETECTED Final  Entamoeba histolytica NOT DETECTED NOT DETECTED Final   Giardia lamblia NOT DETECTED NOT DETECTED Final   Adenovirus F40/41 NOT DETECTED NOT DETECTED Final   Astrovirus NOT DETECTED NOT DETECTED Final   Norovirus GI/GII NOT DETECTED NOT DETECTED Final   Rotavirus A NOT DETECTED NOT DETECTED Final   Sapovirus (I, II, IV, and V) NOT DETECTED NOT DETECTED Final    Comment: Performed at [facility], [address] [place], [place] [address]     Labs: BNP (last 3 results) No results for input(s): BNP in the last 8760 hours. Basic Metabolic Panel: Recent Labs  Lab 12/30/20 1528 12/31/20 0604  NA 138 139  K 4.0 3.8  CL 104 108  CO2 25 25  GLUCOSE 115* 113*  BUN 8 8  CREATININE 1.04 1.04  CALCIUM 9.0 8.4*   Liver Function Tests: Recent Labs  Lab 12/30/20 1528  AST 18  ALT 29  ALKPHOS 49  BILITOT 0.9  PROT 7.6  ALBUMIN 4.5   Recent Labs  Lab 12/30/20 1528  LIPASE 27   No results for input(s): AMMONIA in the last 168 hours. CBC: Recent Labs  Lab 12/30/20 1528 12/31/20 0604  WBC 9.4 7.3  HGB 17.0 15.7  HCT 47.2 43.4  MCV 88.4 88.2  PLT 208 172   Cardiac Enzymes: No results for input(s):  CKTOTAL, CKMB, CKMBINDEX, TROPONINI in the last 168 hours. BNP: Invalid input(s): POCBNP CBG: No results for input(s): GLUCAP in the last 168 hours. D-Dimer No results for input(s): DDIMER in the last 72 hours. Hgb A1c No results for input(s): HGBA1C in the last 72 hours. Lipid Profile No results for input(s): CHOL, HDL, LDLCALC, TRIG, CHOLHDL, LDLDIRECT in the last 72 hours. Thyroid function studies No results for input(s): TSH, T4TOTAL, T3FREE, THYROIDAB in the last 72 hours.  Invalid input(s): FREET3 Anemia work up No results for input(s): VITAMINB12, FOLATE, FERRITIN, TIBC, IRON, RETICCTPCT in the last 72 hours. Urinalysis    Component Value Date/Time   COLORURINE YELLOW (A) 12/30/2020 1528   APPEARANCEUR CLEAR (A) 12/30/2020 1528   LABSPEC 1.018 12/30/2020 1528   PHURINE 6.0 12/30/2020 1528   GLUCOSEU NEGATIVE 12/30/2020 1528   HGBUR SMALL (A) 12/30/2020 1528   BILIRUBINUR NEGATIVE 12/30/2020 1528   KETONESUR 20 (A) 12/30/2020 1528   PROTEINUR NEGATIVE 12/30/2020 1528   NITRITE NEGATIVE 12/30/2020 1528   LEUKOCYTESUR NEGATIVE 12/30/2020 1528   Sepsis Labs Invalid input(s): PROCALCITONIN,  WBC,  LACTICIDVEN Microbiology Recent Results (from the past 240 hour(s))  Resp Panel by RT-PCR (Flu A&B, Covid) Nasopharyngeal Swab     Status: None   Collection Time: 12/30/20  5:35 PM   Specimen: Nasopharyngeal Swab; Nasopharyngeal(NP) swabs in vial transport medium  Result Value Ref Range Status   SARS Coronavirus 2 by RT PCR NEGATIVE NEGATIVE Final    Comment: (NOTE) SARS-CoV-2 target nucleic acids are NOT DETECTED.  The SARS-CoV-2 RNA is generally detectable in upper respiratory specimens during the acute phase of infection. The lowest concentration of SARS-CoV-2 viral copies this assay can detect is 138 copies/mL. A negative result does not preclude SARS-Cov-2 infection and should not be used as the sole basis for treatment or other patient management decisions. A  negative result may occur with  improper specimen collection/handling, submission of specimen other than nasopharyngeal swab, presence of viral mutation(s) within the areas targeted by this assay, and inadequate number of viral copies(<138 copies/mL). A negative result must be combined with clinical observations, patient history, and epidemiological information. The  expected result is Negative.  Fact Sheet for Patients:  BloggerCourse.com  Fact Sheet for Healthcare Providers:  SeriousBroker.it  This test is no t yet approved or cleared by the [place] FDA and  has been authorized for detection and/or diagnosis of SARS-CoV-2 by FDA under an Emergency Use Authorization (EUA). This EUA will remain  in effect (meaning this test can be used) for the duration of the COVID-19 declaration under Section 564(b)(1) of the Act, 21 U.S.C.section 360bbb-3(b)(1), unless the authorization is terminated  or revoked sooner.       Influenza A by PCR NEGATIVE NEGATIVE Final   Influenza B by PCR NEGATIVE NEGATIVE Final    Comment: (NOTE) The Xpert Xpress SARS-CoV-2/FLU/RSV plus assay is intended as an aid in the diagnosis of influenza from Nasopharyngeal swab specimens and should not be used as a sole basis for treatment. Nasal washings and aspirates are unacceptable for Xpert Xpress SARS-CoV-2/FLU/RSV testing.  Fact Sheet for Patients: BloggerCourse.com  Fact Sheet for Healthcare Providers: SeriousBroker.it  This test is not yet approved or cleared by the [place] FDA and has been authorized for detection and/or diagnosis of SARS-CoV-2 by FDA under an Emergency Use Authorization (EUA). This EUA will remain in effect (meaning this test can be used) for the duration of the COVID-19 declaration under Section 564(b)(1) of the Act, 21 U.S.C. section 360bbb-3(b)(1), unless the authorization  is terminated or revoked.  Performed at [facility], [address] [place], [place] [address]   C Difficile Quick Screen w PCR reflex     Status: None   Collection Time: 12/30/20  5:35 PM   Specimen: STOOL  Result Value Ref Range Status   C Diff antigen NEGATIVE NEGATIVE Final   C Diff toxin NEGATIVE NEGATIVE Final   C Diff interpretation No C. difficile detected.  Final    Comment: Performed at [facility], [address] [place], [place] [address]  Gastrointestinal Panel by PCR , Stool     Status: None   Collection Time: 12/30/20  5:35 PM   Specimen: STOOL  Result Value Ref Range Status   Campylobacter species NOT DETECTED NOT DETECTED Final   Plesimonas shigelloides NOT DETECTED NOT DETECTED Final   Salmonella species NOT DETECTED NOT DETECTED Final   Yersinia enterocolitica NOT DETECTED NOT DETECTED Final   Vibrio species NOT DETECTED NOT DETECTED Final   Vibrio cholerae NOT DETECTED NOT DETECTED Final   Enteroaggregative E coli (EAEC) NOT DETECTED NOT DETECTED Final   Enteropathogenic E coli (EPEC) NOT DETECTED NOT DETECTED Final   Enterotoxigenic E coli (ETEC) NOT DETECTED NOT DETECTED Final   Shiga like toxin producing E coli (STEC) NOT DETECTED NOT DETECTED Final   Shigella/Enteroinvasive E coli (EIEC) NOT DETECTED NOT DETECTED Final   Cryptosporidium NOT DETECTED NOT DETECTED Final   Cyclospora cayetanensis NOT DETECTED NOT DETECTED Final   Entamoeba histolytica NOT DETECTED NOT DETECTED Final   Giardia lamblia NOT DETECTED NOT DETECTED Final   Adenovirus F40/41 NOT DETECTED NOT DETECTED Final   Astrovirus NOT DETECTED NOT DETECTED Final   Norovirus GI/GII NOT DETECTED NOT DETECTED Final   Rotavirus A NOT DETECTED NOT DETECTED Final   Sapovirus (I, II, IV, and V) NOT DETECTED NOT DETECTED Final    Comment: Performed at [facility], [address], [place], [place] [address]     Time coordinating discharge: Over 30  minutes  SIGNED:   Pennie Banter, DO [facility] 01/01/2021, 2:01 PM   If  7PM-7AM, please contact night-coverage [email or web address]

## 2021-01-01 NOTE — Progress Notes (Signed)
Patient transported to endo lab to have colonoscopy

## 2021-01-02 ENCOUNTER — Other Ambulatory Visit: Payer: Self-pay | Admitting: Gastroenterology

## 2021-01-02 DIAGNOSIS — R1084 Generalized abdominal pain: Secondary | ICD-10-CM

## 2021-01-02 LAB — HCV AB W REFLEX TO QUANT PCR: HCV Ab: <0.1 s/co ratio (ref 0.0–0.9)

## 2021-01-02 LAB — HEPATITIS B SURFACE ANTIBODY, QUANTITATIVE: Hep B S AB Quant (Post): 3.9 m[IU]/mL — ABNORMAL LOW (ref >9.9)

## 2021-01-02 LAB — HCV INTERPRETATION

## 2021-01-02 MED ORDER — HYDROCODONE-ACETAMINOPHEN 5-325 MG PO TABS: 1.0000 | ORAL_TABLET | Freq: Four times a day (QID) | ORAL | 0 refills | Status: DC | PRN

## 2021-01-03 ENCOUNTER — Encounter: Payer: Self-pay | Admitting: Gastroenterology

## 2021-01-04 ENCOUNTER — Telehealth: Payer: Self-pay

## 2021-01-04 LAB — QUANTIFERON-TB GOLD PLUS (RQFGPL)
QuantiFERON Mitogen Value: >10 IU/mL
QuantiFERON Nil Value: 0.05 IU/mL
QuantiFERON TB1 Ag Value: 0.08 IU/mL
QuantiFERON TB2 Ag Value: 0.08 IU/mL

## 2021-01-04 LAB — QUANTIFERON-TB GOLD PLUS: QuantiFERON-TB Gold Plus: NEGATIVE

## 2021-01-04 LAB — SURGICAL PATHOLOGY

## 2021-01-04 NOTE — Telephone Encounter (Signed)
Call and left a message for call back.   

## 2021-01-04 NOTE — Telephone Encounter (Signed)
-----   Message from Toney Reil, MD sent at 01/04/2021  3:16 PM EDT ----- Please inform patient that the pathology results from colonoscopy confirmed that he has severe ulcerative colitis.  The blood work is also reassuring and we can proceed to apply for long-term medication, Entyvio.  If patient agrees, go ahead and apply for Entyvio as a fresh start  I will see him for follow-up as scheduled  RV

## 2021-01-05 ENCOUNTER — Encounter: Payer: Self-pay | Admitting: Gastroenterology

## 2021-01-05 ENCOUNTER — Telehealth: Payer: Self-pay | Admitting: Gastroenterology

## 2021-01-05 NOTE — Telephone Encounter (Signed)
Patient states he is okay with applying for the [facility]. Filled out paper work and faxed to [employer]

## 2021-01-05 NOTE — Telephone Encounter (Signed)
Called to reschedule patient because Dr Allegra Lai will be out of the office on 01/11/21.  Patient was angry and verbally abusive, his mother then got on the phone.  I tried to explain that Dr Allegra Lai would be out of the office on 01/11/21 and we can see her son on 01/21/21.  Patient continued to shout profanities in the back ground.  I stated that I was here to help but the language was unnecessary.  Made appointment on 01/21/21 and sent patient a letter. Mother, Angelique Blonder, asked for Supervisor to call her back at [phone].

## 2021-01-06 ENCOUNTER — Telehealth: Payer: Self-pay

## 2021-01-06 NOTE — Telephone Encounter (Signed)
Practice Admin spoke with mother this morning. She was very apologetic for son's language.

## 2021-01-06 NOTE — Telephone Encounter (Signed)
Hospital made patient a appointment for 01/11/2021 with Dr. Allegra Lai. I Informed Misty Stanley yesterday that Dr. Allegra Lai was on vacation then and could not do the appointment then. She then called the patient to rescheduled and the patient got upset about the rescheduled the appointment. Patient mother got on the phone and you could hear him in the back ground yelling. Asher Muir called mother this morning and she states she is down here from [place] trying to help him with foods he needs to eat and helping him since discharge from the hospital. She had some questions about the [facility] and wants to see if patient can be seen sooner. I called patient mother and he said patient has work the last 2 nights and is doing much better since the hospital. She states he does drink a lot of Gatorade but he states that the only thing that does not make him sick. She states she wants to know the side effects of the [facility] informed her that Dr. Allegra Lai would have to discuss that with them. She states do people tolerate the medication okay. Informed her they do and the medications controls his ulcerative colitis and makes him not have flares or as many flares. Moved his appointment by double booking him to 01/14/2021. Informed her that he might have to wait a little longer because he is double book with another patient. She ask how long I said I did not know. She states can we put her on zoom so she can be at the appointment. Informed her that he would have to call or video her that we could not do that. She states she will get him to call her then in the appointment. Also informed her he would need to fill out a HIPPA form so I can talk to her when she calls about his health. She states he will do this

## 2021-01-07 DIAGNOSIS — K5190 Ulcerative colitis, unspecified, without complications: Secondary | ICD-10-CM | POA: Diagnosis not present

## 2021-01-07 DIAGNOSIS — K8689 Other specified diseases of pancreas: Secondary | ICD-10-CM | POA: Diagnosis not present

## 2021-01-08 LAB — THIOPURINE METHYLTRANSFERASE (TPMT), RBC: TPMT Activity:: 21.4 Units/mL RBC

## 2021-01-11 ENCOUNTER — Ambulatory Visit: Payer: BC Managed Care – PPO | Admitting: Gastroenterology

## 2021-01-14 ENCOUNTER — Other Ambulatory Visit: Payer: Self-pay

## 2021-01-14 ENCOUNTER — Encounter: Payer: Self-pay | Admitting: Gastroenterology

## 2021-01-14 ENCOUNTER — Ambulatory Visit: Payer: BC Managed Care – PPO | Admitting: Gastroenterology

## 2021-01-14 VITALS — BP 122/69 | HR 116 | Temp 98.2°F | Ht 70.0 in | Wt 191.5 lb

## 2021-01-14 DIAGNOSIS — K51011 Ulcerative (chronic) pancolitis with rectal bleeding: Secondary | ICD-10-CM | POA: Diagnosis not present

## 2021-01-14 DIAGNOSIS — Z23 Encounter for immunization: Secondary | ICD-10-CM | POA: Diagnosis not present

## 2021-01-14 DIAGNOSIS — F909 Attention-deficit hyperactivity disorder, unspecified type: Secondary | ICD-10-CM | POA: Insufficient documentation

## 2021-01-14 MED ORDER — OMEPRAZOLE 20 MG PO CPDR: 20.0000 mg | DELAYED_RELEASE_CAPSULE | Freq: Two times a day (BID) | ORAL | 0 refills | Status: DC

## 2021-01-14 MED ORDER — SHINGRIX 50 MCG/0.5ML IM SUSR: 0.5000 mL | Freq: Once | INTRAMUSCULAR | 0 refills | Status: AC

## 2021-01-14 NOTE — Patient Instructions (Signed)
[facility] for your [facility] Infusion phone number is [phone]. Please call them

## 2021-01-14 NOTE — Progress Notes (Signed)
Arlyss Repress, MD [address]  [address]  [place], [place] [address]  Main: [phone]  Fax: [phone]    Gastroenterology Consultation  Referring Provider:     [facility] Primary Care Physician:  [facility] Primary Gastroenterologist:  Dr. Arlyss Repress Reason for Consultation:     Ulcerative colitis        HPI:   Brent [name] is a 35 y.o. male referred by Dr. [facility]  for consultation & management of ulcerative colitis.  Patient was recently admitted to [facility] end of March 2022 secondary to left lower quadrant pain, rectal bleeding and constipation.  CT abdomen pelvis with contrast revealed pancolitis.  GI profile was negative for infectious etiology.  He underwent colonoscopy which confirmed severe ulcerative pancolitis.  Patient is discharged home on prednisone 40 mg daily along with empiric antibiotics.  Apparently, patient reports that he developed severe nausea on prednisone 40 mg, therefore he stopped taking it.  He denies any rectal bleeding however is experiencing intermittent abdominal cramps associated with nonbloody diarrhea.  His weight is stable.  He is taking omeprazole 20 mg as needed only.  Patient underwent extensive laboratory work in order to start Biologic for maintenance therapy.  He chose Entyvio over Humira given mode of administration.  Thompson Grayer has been applied for insurance approval.  He vapes tobacco and marijuana  NSAIDs: None  Antiplts/Anticoagulants/Anti thrombotics: None  GI Procedures:  Colonoscopy 01/01/2021 - The examined portion of the ileum was normal. Biopsied. - Severe (Mayo Score 3) pancolitis ulcerative colitis, new since the last examination. Biopsied.  DIAGNOSIS:  A. TERMINAL ILEUM; COLD BIOPSY:  - ILEAL MUCOSA WITH INTACT VILLI.  - NEGATIVE FOR ACTIVE INFLAMMATION, INTRAEPITHELIAL LYMPHOCYTOSIS,  INFECTIOUS AGENTS, AND GRANULOMAS.   B. RIGHT COLON; COLD  BIOPSY:  - DIFFUSE MARKED ACTIVE COLITIS WITH ULCERATION, SEE COMMENT.   C. LEFT COLON; COLD BIOPSY:  - DIFFUSE MARKED ACTIVE COLITIS WITH ULCERATION.   D. RECTUM; COLD BIOPSY:  - DIFFUSE MARKED ACTIVE PROCTITIS.   Comment:  There is diffuse active colitis and proctitis with changes of  chronicity. Cryptitis and crypt abscesses are extensive throughout all  samples. Architectural distortion is present, with abnormal crypt  distribution and shape, and basal plasmacytosis.   No granulomas, infectious agents, or viral cytopathic effects are  identified.   All samples are negative for dysplasia.   The histologic features and distribution of inflammation are compatible  with inflammatory bowel disease. Medication effect and infection would  seem less likely, but clinical correlation and follow up are  recommended.   History reviewed. No pertinent past medical history.  Past Surgical History:  Procedure Laterality Date  . COLONOSCOPY WITH PROPOFOL N/A 01/01/2021   Procedure: COLONOSCOPY WITH PROPOFOL;  Surgeon: Toney Reil, MD;  Location: [facility];  Service: Gastroenterology;  Laterality: N/A;  . TYMPANOSTOMY TUBE PLACEMENT      Current Outpatient Medications:  .  amphetamine-dextroamphetamine (ADDERALL XR) 15 MG 24 hr capsule, Take 15 mg by mouth every morning., Disp: , Rfl:  .  buPROPion (WELLBUTRIN XL) 150 MG 24 hr tablet, Take 450 mg by mouth daily., Disp: , Rfl:  .  calcium carbonate (TUMS - DOSED IN MG ELEMENTAL CALCIUM) 500 MG chewable tablet, Chew 1 tablet by mouth daily., Disp: , Rfl:  .  HYDROcodone-acetaminophen (NORCO/VICODIN) 5-325 MG tablet, Take 1 tablet by mouth every 6 (six) hours as needed for moderate pain., Disp: 15 tablet,  Rfl: 0 .  ondansetron (ZOFRAN) 8 MG tablet, Take 1 tablet (8 mg total) by mouth every 8 (eight) hours as needed for nausea or vomiting., Disp: 20 tablet, Rfl: 0 .  Zoster Vaccine Adjuvanted (SHINGRIX) injection, Inject 0.5 mLs  into the muscle once for 1 dose., Disp: 0.5 mL, Rfl: 0 .  omeprazole (PRILOSEC) 20 MG capsule, Take 1 capsule (20 mg total) by mouth 2 (two) times daily before a meal., Disp: 60 capsule, Rfl: 0 .  predniSONE (DELTASONE) 20 MG tablet, Take 2 tablets (40 mg total) by mouth daily with breakfast. (Patient not taking: Reported on 01/14/2021), Disp: 30 tablet, Rfl: 1  History reviewed. No pertinent family history.   Social History   Tobacco Use  . Smoking status: Never Smoker  . Smokeless tobacco: Never Used  Vaping Use  . Vaping Use: Former  Substance Use Topics  . Alcohol use: Not Currently  . Drug use: Not Currently    Allergies as of 01/14/2021 - Review Complete 01/14/2021  Allergen Reaction Noted  . Wasp venom protein Shortness Of Breath 02/07/2019    Review of Systems:    All systems reviewed and negative except where noted in HPI.   Physical Exam:  BP 122/69 (BP Location: Left Arm, Patient Position: Sitting, Cuff Size: Normal)   Pulse (!) 116   Temp 98.2 F (36.8 C) (Oral)   Ht 5\' 10"  (1.778 m)   Wt 191 lb 8 oz (86.9 kg)   BMI 27.48 kg/m  No LMP for male patient.  General:   Alert,  Well-developed, well-nourished, pleasant and cooperative in NAD Head:  Normocephalic and atraumatic. Eyes:  Sclera clear, no icterus.   Conjunctiva pink. Ears:  Normal auditory acuity. Nose:  No deformity, discharge, or lesions. Mouth:  No deformity or lesions,oropharynx pink & moist. Neck:  Supple; no masses or thyromegaly. Lungs:  Respirations even and unlabored.  Clear throughout to auscultation.   No wheezes, crackles, or rhonchi. No acute distress. Heart:  Regular rate and rhythm; no murmurs, clicks, rubs, or gallops. Abdomen:  Normal bowel sounds. Soft, lower abdominal tenderness and non-distended without masses, hepatosplenomegaly or hernias noted.  No guarding or rebound tenderness.   Rectal: Not performed Msk:  Symmetrical without gross deformities. Good, equal movement & strength  bilaterally. Pulses:  Normal pulses noted. Extremities:  No clubbing or edema.  No cyanosis. Neurologic:  Alert and oriented x3;  grossly normal neurologically. Skin:  Intact without significant lesions or rashes. No jaundice. Psych:  Alert and cooperative. Normal mood and affect.  Imaging Studies: Reviewed  Assessment and Plan:   Sneijder Bernards is a 35 y.o. Caucasian male with history of ADHD, tobacco use, severe pan ulcerative colitis diagnosed in 12/2020, biopsy-proven  Pan ulcerative colitis, severe Advised patient to try prednisone 20 mg 1-2 times daily as a split dose Start omeprazole 20 mg twice daily Awaiting for Entyvio approval by his insurance Normal TPMT levels Normal vitamin D levels QuantiFERON gold negative Recommend Twinrix vaccine Recommend Pneumovax and Shingrix  ?  Pancreatic lesion Recommend MRI pancreas protocol as outpatient   Follow up in 3 months   01/2021, MD

## 2021-01-19 ENCOUNTER — Telehealth: Payer: Self-pay | Admitting: Gastroenterology

## 2021-01-19 NOTE — Telephone Encounter (Signed)
Hi Lisa,  The payment was definitely voided (see below). His debit/credit card issuer may have placed a temporary hold [appears as "pending" on his card], which will eventually drop off. How long that takes will be based on his card issuer's policy.     Hope this helps,  Winnifred Friar  [facility] Department  Sr Investment Analyst  Direct Dial: [phone]  Fax: [phone] Website: [email or web address]  From: Elicia [email or web address]>  Sent: Monday, January 18, 2021 8:51 AM To: Winnifred [email or web address]> Subject:   Patient MRN- [id number] Brent [name]     On Thursday 01/14/21 this patient was charged $200 for his copay instead of $100.  I realized it at the end of the day and refunded the $200 amount to his credit card.  I called patient and explained the mistake and assured him on our end that it was refunded.  The patient has called today and left voice mail that the  amount was not taken off by his bank .  Can you please review this transaction and double check me?  I need to call this patient back with an explanation.  This is a very difficult patient.   Elicia Lamp    Sent from Mail for [employer]

## 2021-01-20 ENCOUNTER — Telehealth: Payer: Self-pay | Admitting: Gastroenterology

## 2021-01-20 NOTE — Telephone Encounter (Signed)
We are still waiting on Entyvio approval. Email Sherrilyn Rist to find out the status of the medication before I call the patient back

## 2021-01-20 NOTE — Telephone Encounter (Signed)
Patient LVM to call back about his medication that has not been sent to his pharmacy .  Patient is upset and states that he is in pain and has been waiting for 1 1/2 weeks and asked for call back. He stated "I want someone to call me back TODAY!"

## 2021-01-20 NOTE — Telephone Encounter (Signed)
Per Kair  request : ACTION ITEM: SR to follow up with MDO to obtain documentation of that the patient has tried and had an inadequate response to ONE conventional agent (i.e., 6-mercaptopurine, azathioprine, balsalazide, corticosteroids, cyclosporine, mesalamine, steroid suppositories, sulfasalazine) used in the treatment of UC for **at least 31-months** [medical record documentation required]; OR the patient has an intolerance, FDA labeled contraindication, or hypersensitivity to ALL of the conventional agents used in the treatment of UC [medical record documentation required], per medical policy.  Use one of the links below to launch your preferred Infusion application where you can search for the patient referenced above:

## 2021-01-20 NOTE — Telephone Encounter (Signed)
Per Sherrilyn Rist t needs a prior authorization but let me find out where they are with process. Patient states that he talk to [facility] a hour of two ago and they told her that the medication was approved by insurance and they were waiting for a prescription from [place]. Email Sherrilyn Rist to find out if there was anything else she needs because they should already have the prescription. Informed patient that we have not been informed it has been approved and they have not told us they needed any more information. Patient begins to raise his voice and cuss at me stating that he knows what he is talking about and he does not know why he is getting two sides of the story. Informed patient I am sorry but that  It being in the prior authorization process is what they informed me of 5 minutes ago. Patient states what do we expect him to do. He states he needs the medication and he is in so much pain. He states he can not get out of the bed. He states can Dr. Allegra Lai call him in pain medication. Informed patient that Dr. Allegra Lai does not prescribe pain medication. Asked him if he was taking the prednisone 1 to 2 tablets a day. He states he informed us at the appointment that he does not tolerate that medication and he is not going to take it. He stated if we do not help him soon he will go back to the ER and find a new GI doctor. Asher Muir talk to the patient and he cuss at her and raised his voice and repeated the same thing he told me.

## 2021-01-21 ENCOUNTER — Ambulatory Visit: Payer: BC Managed Care – PPO | Admitting: Gastroenterology

## 2021-01-25 ENCOUNTER — Encounter: Payer: Self-pay | Admitting: Gastroenterology

## 2021-01-25 ENCOUNTER — Telehealth: Payer: Self-pay

## 2021-01-25 NOTE — Telephone Encounter (Signed)
Patient verbalized understanding  

## 2021-01-25 NOTE — Telephone Encounter (Signed)
Sherrilyn Rist states insurance denied the [place] for patient. They are going to do a appeal on the medication since patient has severe ulcerative pancolitis. They are going to resubmit notes and also Dr. Allegra Lai wrote a letter also.  Tried to call patient to inform patient and left a message for call back

## 2021-01-28 ENCOUNTER — Telehealth: Payer: Self-pay

## 2021-01-28 ENCOUNTER — Telehealth: Payer: Self-pay | Admitting: Gastroenterology

## 2021-01-28 NOTE — Telephone Encounter (Signed)
Per Sherrilyn Rist: He called and spoke with our office.  We needed Dr. Allegra Lai letter we received Monday to support the need to go directly to the [facility] since he hadn't been on other medications.  .  I'm waiting to hear back from my pharmacy to see what the expectation is for approval and will keep you posted.

## 2021-01-28 NOTE — Telephone Encounter (Signed)
Per Sherrilyn Rist: Mr Brent [name] is approved - we just heard back from [facility].   Called and informed patient and he verbalized understanding

## 2021-01-28 NOTE — Telephone Encounter (Signed)
Called patient mother back which is on the patient DPR. Informed Patient mother that [facility] takes care of all the authorizations and getting the medication approved by insurance. Gave her also the number to [facility] so she can call them.

## 2021-01-28 NOTE — Telephone Encounter (Signed)
Patient's mother, Angelique Blonder,  called LVM to call her at [phone] so she can help with the insurance issue.

## 2021-01-28 NOTE — Telephone Encounter (Signed)
Patient called and states he talk to his insurance company and they state the have not received any prior authorization for the Medication. Informed patient that [facility] takes care of all of the prior authorization and getting the patient the medication. Gave him the number to there office so he can call them. Patient states he will call them now

## 2021-02-01 ENCOUNTER — Telehealth: Payer: Self-pay

## 2021-02-01 NOTE — Telephone Encounter (Signed)
Per Sharrie Rothman: Just FYI we are waiting on Brent [name] to agree on his copay.  He is set up on the copay program so drug is only $5 but he hasn't met [facility] yet for year so has to pay the nursing visit until [facility] met.  He is supposed to call us back today and let us know but just wanted to keep you posted

## 2021-02-10 ENCOUNTER — Telehealth: Payer: Self-pay

## 2021-02-10 DIAGNOSIS — K51919 Ulcerative colitis, unspecified with unspecified complications: Secondary | ICD-10-CM | POA: Diagnosis not present

## 2021-02-10 NOTE — Telephone Encounter (Signed)
Error

## 2021-02-11 ENCOUNTER — Ambulatory Visit: Payer: BC Managed Care – PPO

## 2021-02-24 DIAGNOSIS — K51919 Ulcerative colitis, unspecified with unspecified complications: Secondary | ICD-10-CM | POA: Diagnosis not present

## 2021-02-25 DIAGNOSIS — M79644 Pain in right finger(s): Secondary | ICD-10-CM | POA: Diagnosis not present

## 2021-04-07 DIAGNOSIS — K51919 Ulcerative colitis, unspecified with unspecified complications: Secondary | ICD-10-CM | POA: Diagnosis not present

## 2021-04-08 DIAGNOSIS — F909 Attention-deficit hyperactivity disorder, unspecified type: Secondary | ICD-10-CM | POA: Diagnosis not present

## 2021-04-08 DIAGNOSIS — F322 Major depressive disorder, single episode, severe without psychotic features: Secondary | ICD-10-CM | POA: Diagnosis not present

## 2021-04-14 DIAGNOSIS — K51919 Ulcerative colitis, unspecified with unspecified complications: Secondary | ICD-10-CM | POA: Diagnosis not present

## 2021-04-15 ENCOUNTER — Ambulatory Visit: Payer: BC Managed Care – PPO | Admitting: Gastroenterology

## 2021-05-21 DIAGNOSIS — K51919 Ulcerative colitis, unspecified with unspecified complications: Secondary | ICD-10-CM | POA: Diagnosis not present

## 2021-06-10 DIAGNOSIS — K51919 Ulcerative colitis, unspecified with unspecified complications: Secondary | ICD-10-CM | POA: Diagnosis not present

## 2021-07-16 DIAGNOSIS — K51919 Ulcerative colitis, unspecified with unspecified complications: Secondary | ICD-10-CM | POA: Diagnosis not present

## 2021-08-05 DIAGNOSIS — K51919 Ulcerative colitis, unspecified with unspecified complications: Secondary | ICD-10-CM | POA: Diagnosis not present

## 2021-09-29 DIAGNOSIS — K51919 Ulcerative colitis, unspecified with unspecified complications: Secondary | ICD-10-CM | POA: Diagnosis not present

## 2021-11-18 ENCOUNTER — Telehealth: Payer: Self-pay

## 2021-11-18 NOTE — Telephone Encounter (Signed)
Per Sherrilyn Rist Just sharing as his insurance termed so may be delay while we obtain his new insurance from him and get new PA

## 2021-11-25 ENCOUNTER — Telehealth: Payer: Self-pay

## 2021-11-25 NOTE — Telephone Encounter (Signed)
[facility] calling regarding pts Entyvio infusion. They have not been able to reach this pt. Last infusion was done in December 14th and is now for his February treatment. Please try and reach out to the patient and contact [facility] with what they need to do about this. [phone].

## 2021-11-25 NOTE — Telephone Encounter (Signed)
Tried to call patient but number is busy will send mychart message

## 2022-03-16 IMAGING — CT CT RENAL STONE PROTOCOL
2 of 4 series · 12 of 46 positions shown, 14 images · non-contrast
Comparison: None
COMPARISON: None

Addendum:
CLINICAL DATA: Flank pain, suspected kidney stone in a 34-year-old
male with LEFT lower quadrant abdominal pain and constipation for
several days.

EXAM:
CT ABDOMEN AND PELVIS WITHOUT CONTRAST
TECHNIQUE: Multidetector CT imaging of the abdomen and pelvis was performed
following the standard protocol without IV contrast.

[Series 2: stone full standard · axial · 0.86mm/px · z∈[-169,+241]mm · 9 of 100 slices shown, 11 images]
[im 9/100  soft-tissue]
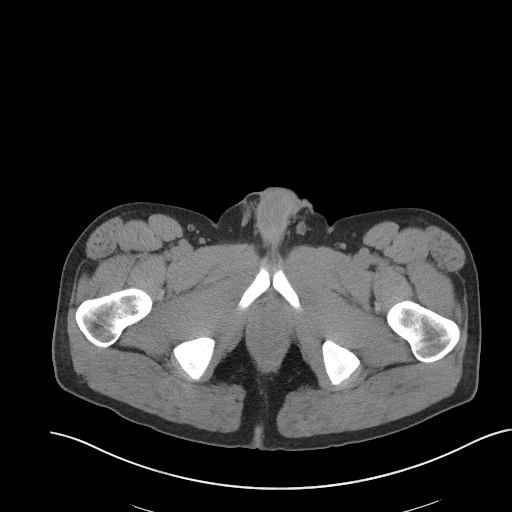
[im 9/100  bone]
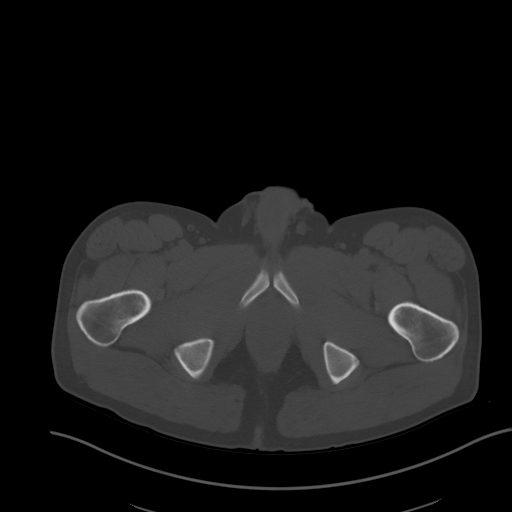
[im 18/100  soft-tissue]
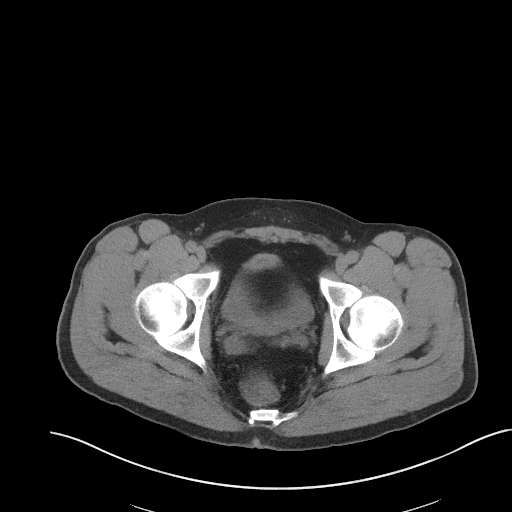
[im 31/100  soft-tissue]
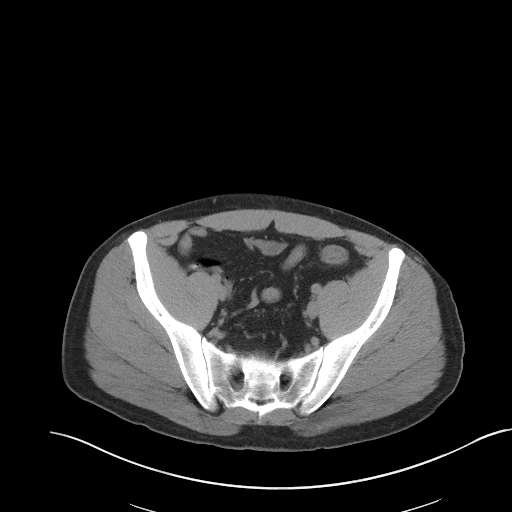
[im 39/100  soft-tissue]
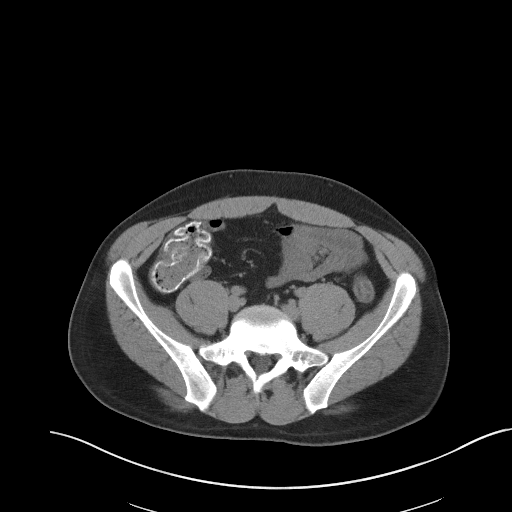
[im 52/100  soft-tissue]
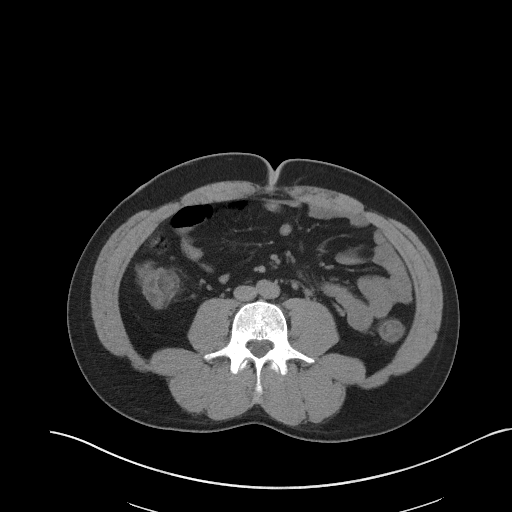
[im 61/100  soft-tissue]
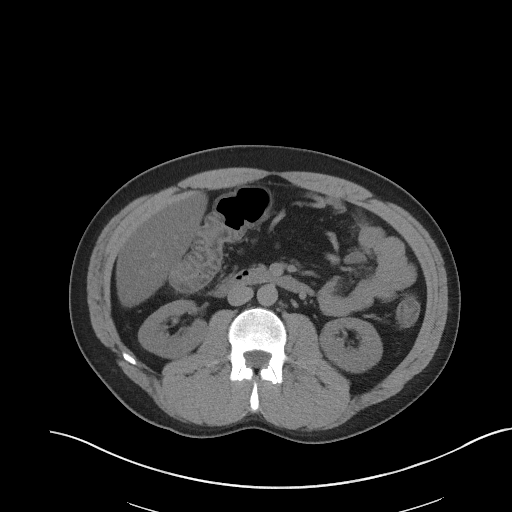
[im 69/100  soft-tissue]
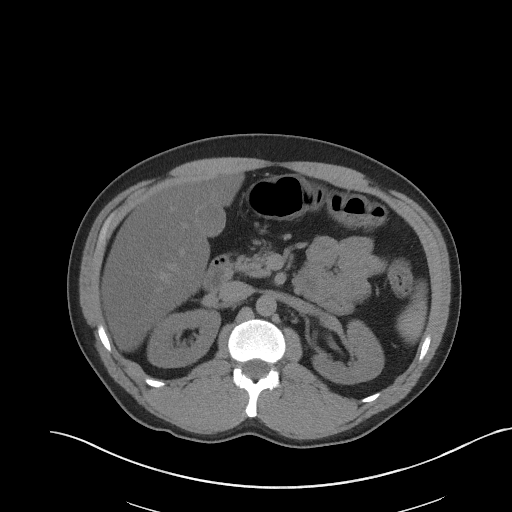
[im 82/100  soft-tissue]
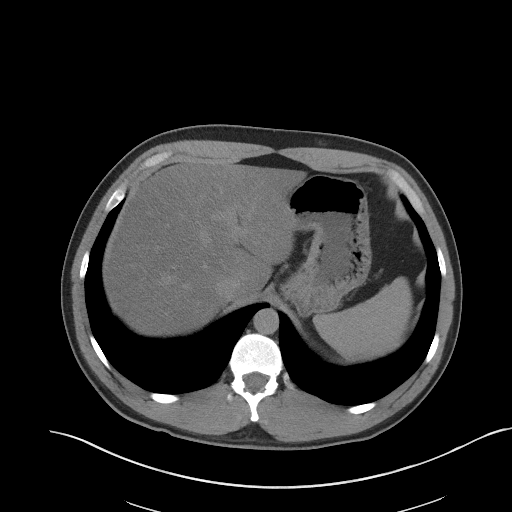
[im 91/100  soft-tissue]
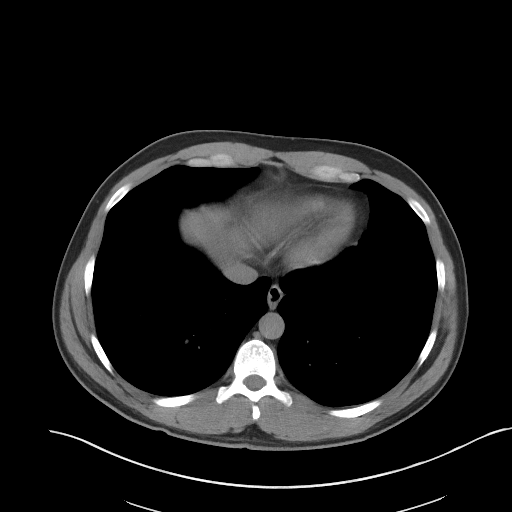
[im 91/100  bone]
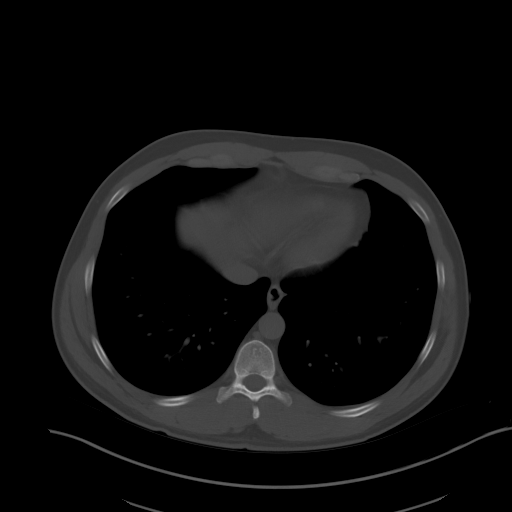

[Series 5: coronal · coronal · 0.74mm/px · 3 of 130 slices shown]
[im 44/130  soft-tissue]
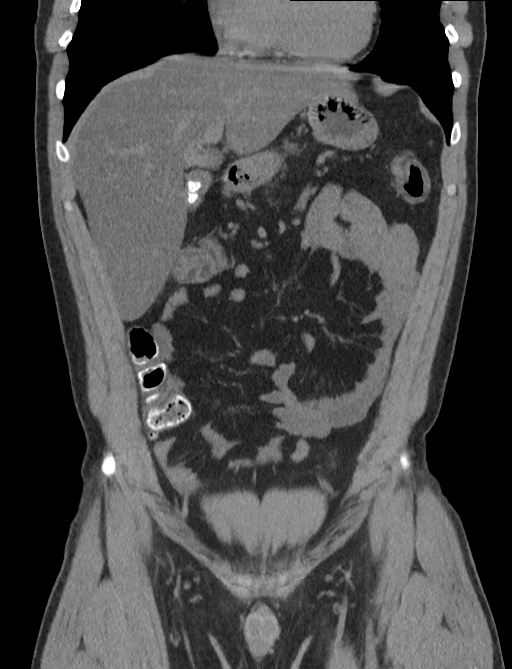
[im 58/130  soft-tissue]
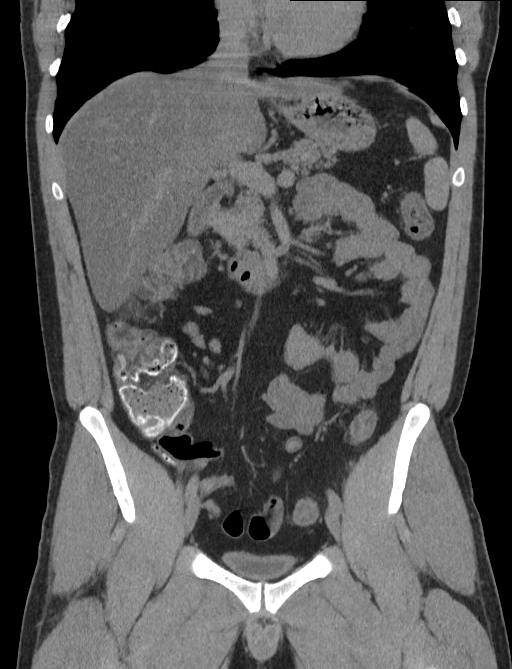
[im 72/130  soft-tissue]
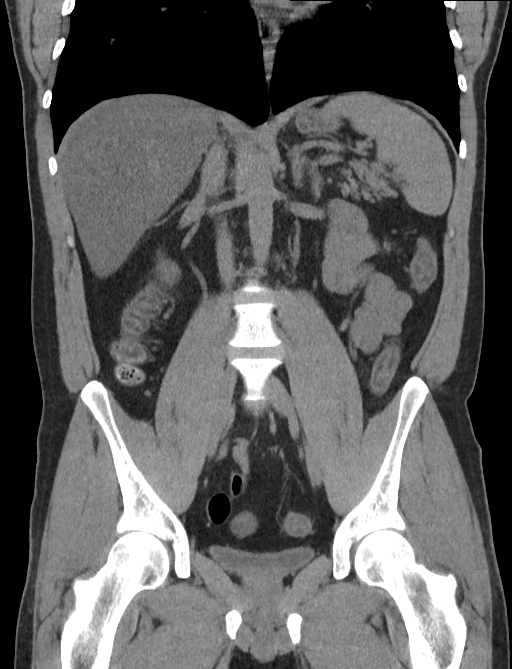

[12 of 46 positions shown; findings below may reference images not displayed]

FINDINGS: Lower chest: Lung bases are clear.

Hepatobiliary: Severe hepatic steatosis. Liver contour is smooth.
Cholelithiasis without pericholecystic stranding.

Pancreas: Change in density of the pancreas towards the splenic
hilum. When viewed in the coronal plane area of more confluent soft
tissue measuring approximately 1.8 cm, potentially variant
parenchyma in this location though difficult to exclude underlying
lesion. No peripancreatic stranding.

Spleen: Smooth contour of the spleen.  No focal lesion.

Adrenals/Urinary Tract: Adrenal glands are normal.

Smooth renal contours. Urinary bladder under distended. No
nephrolithiasis. No ureteral calculi.

No perinephric stranding. No hydronephrosis. Small LEFT extrarenal
pelvis.

Stomach/Bowel: Stomach and small bowel without acute process
proximally. Mural stratification of the mid to distal ileum. No
significant perienteric stranding. No sign of bowel obstruction.

Appendix is normal. Beginning in the ascending colon there is
profound colonic thickening and moderate pericolonic stranding. This
extends through the sigmoid colon and potentially into the rectum no
pneumatosis.

Vascular/Lymphatic: Normal caliber abdominal aorta. Smooth contour
the IVC. The There is no gastrohepatic or hepatoduodenal ligament
lymphadenopathy. No retroperitoneal or mesenteric lymphadenopathy.

No pelvic sidewall lymphadenopathy.

Reproductive: Prostate unremarkable by CT.

Other: No ascites.

Musculoskeletal: No acute musculoskeletal process.
IMPRESSION: 1. Profound colonic thickening and moderate pericolonic stranding.
This extends through the sigmoid colon and potentially into the
rectum. Findings are suspicious for colitis, more likely infectious
or inflammatory. Given the distribution of involvement would suggest
correlation with any recent antibiotic therapy and exclusion of
process such as C difficile colitis. Presence of potential ongoing
or prior enteritis would support the possibility of inflammatory
bowel disease but is nonspecific.
2. Severe hepatic steatosis.
3. Cholelithiasis without evidence of acute cholecystitis.
4. Change in density of the pancreas towards the splenic hilum. When
viewed in the coronal plane area of more confluent soft tissue in
this location makes it difficult to exclude underlying lesion.
Consider follow-up with contrasted CT or MRI. This does not appear
to represent an acute process. Given the possibility of underlying
lesion prompt follow-up is however suggested. This could be
performed as an outpatient particularly if MRI is considered as MRI
is often of better quality outside of the acute setting.

ADDENDUM:
These results were called by telephone at the time of interpretation
on 12/30/2020 at [DATE] to provider FREDY MARCELO ZAES , who verbally
acknowledged these results.

*** End of Addendum ***
FINDINGS: Lower chest: Lung bases are clear.

Hepatobiliary: Severe hepatic steatosis. Liver contour is smooth.
Cholelithiasis without pericholecystic stranding.

Pancreas: Change in density of the pancreas towards the splenic
hilum. When viewed in the coronal plane area of more confluent soft
tissue measuring approximately 1.8 cm, potentially variant
parenchyma in this location though difficult to exclude underlying
lesion. No peripancreatic stranding.

Spleen: Smooth contour of the spleen.  No focal lesion.

Adrenals/Urinary Tract: Adrenal glands are normal.

Smooth renal contours. Urinary bladder under distended. No
nephrolithiasis. No ureteral calculi.

No perinephric stranding. No hydronephrosis. Small LEFT extrarenal
pelvis.

Stomach/Bowel: Stomach and small bowel without acute process
proximally. Mural stratification of the mid to distal ileum. No
significant perienteric stranding. No sign of bowel obstruction.

Appendix is normal. Beginning in the ascending colon there is
profound colonic thickening and moderate pericolonic stranding. This
extends through the sigmoid colon and potentially into the rectum no
pneumatosis.

Vascular/Lymphatic: Normal caliber abdominal aorta. Smooth contour
the IVC. The There is no gastrohepatic or hepatoduodenal ligament
lymphadenopathy. No retroperitoneal or mesenteric lymphadenopathy.

No pelvic sidewall lymphadenopathy.

Reproductive: Prostate unremarkable by CT.

Other: No ascites.

Musculoskeletal: No acute musculoskeletal process.
IMPRESSION: 1. Profound colonic thickening and moderate pericolonic stranding.
This extends through the sigmoid colon and potentially into the
rectum. Findings are suspicious for colitis, more likely infectious
or inflammatory. Given the distribution of involvement would suggest
correlation with any recent antibiotic therapy and exclusion of
process such as C difficile colitis. Presence of potential ongoing
or prior enteritis would support the possibility of inflammatory
bowel disease but is nonspecific.
2. Severe hepatic steatosis.
3. Cholelithiasis without evidence of acute cholecystitis.
4. Change in density of the pancreas towards the splenic hilum. When
viewed in the coronal plane area of more confluent soft tissue in
this location makes it difficult to exclude underlying lesion.
Consider follow-up with contrasted CT or MRI. This does not appear
to represent an acute process. Given the possibility of underlying
lesion prompt follow-up is however suggested. This could be
performed as an outpatient particularly if MRI is considered as MRI
is often of better quality outside of the acute setting.

## 2022-10-21 ENCOUNTER — Inpatient Hospital Stay
Admission: EM | Admit: 2022-10-21 | Discharge: 2022-10-23 | DRG: 439 | Disposition: A | Discharge status: Home / Self Care | Payer: BC Managed Care – PPO | Attending: Internal Medicine | Admitting: Internal Medicine

## 2022-10-21 ENCOUNTER — Other Ambulatory Visit: Payer: Self-pay

## 2022-10-21 ENCOUNTER — Emergency Department: Payer: BC Managed Care – PPO

## 2022-10-21 DIAGNOSIS — R1013 Epigastric pain: Secondary | ICD-10-CM | POA: Diagnosis not present

## 2022-10-21 DIAGNOSIS — E663 Overweight: Secondary | ICD-10-CM | POA: Diagnosis not present

## 2022-10-21 DIAGNOSIS — I10 Essential (primary) hypertension: Secondary | ICD-10-CM | POA: Diagnosis not present

## 2022-10-21 DIAGNOSIS — K8500 Idiopathic acute pancreatitis without necrosis or infection: Secondary | ICD-10-CM

## 2022-10-21 DIAGNOSIS — R0789 Other chest pain: Secondary | ICD-10-CM | POA: Diagnosis not present

## 2022-10-21 DIAGNOSIS — Z23 Encounter for immunization: Secondary | ICD-10-CM | POA: Diagnosis not present

## 2022-10-21 DIAGNOSIS — K219 Gastro-esophageal reflux disease without esophagitis: Secondary | ICD-10-CM | POA: Diagnosis present

## 2022-10-21 DIAGNOSIS — Z6831 Body mass index (BMI) 31.0-31.9, adult: Secondary | ICD-10-CM

## 2022-10-21 DIAGNOSIS — Z79899 Other long term (current) drug therapy: Secondary | ICD-10-CM

## 2022-10-21 DIAGNOSIS — K859 Acute pancreatitis without necrosis or infection, unspecified: Secondary | ICD-10-CM | POA: Diagnosis not present

## 2022-10-21 DIAGNOSIS — Z6833 Body mass index (BMI) 33.0-33.9, adult: Secondary | ICD-10-CM

## 2022-10-21 DIAGNOSIS — R7989 Other specified abnormal findings of blood chemistry: Secondary | ICD-10-CM | POA: Diagnosis not present

## 2022-10-21 DIAGNOSIS — K760 Fatty (change of) liver, not elsewhere classified: Secondary | ICD-10-CM | POA: Diagnosis present

## 2022-10-21 DIAGNOSIS — K5100 Ulcerative (chronic) pancolitis without complications: Secondary | ICD-10-CM | POA: Diagnosis not present

## 2022-10-21 DIAGNOSIS — Z91030 Bee allergy status: Secondary | ICD-10-CM

## 2022-10-21 DIAGNOSIS — K869 Disease of pancreas, unspecified: Secondary | ICD-10-CM | POA: Diagnosis present

## 2022-10-21 DIAGNOSIS — K8020 Calculus of gallbladder without cholecystitis without obstruction: Secondary | ICD-10-CM | POA: Diagnosis not present

## 2022-10-21 DIAGNOSIS — R000 Tachycardia, unspecified: Secondary | ICD-10-CM | POA: Diagnosis not present

## 2022-10-21 DIAGNOSIS — K5190 Ulcerative colitis, unspecified, without complications: Secondary | ICD-10-CM | POA: Diagnosis present

## 2022-10-21 LAB — URINALYSIS, ROUTINE W REFLEX MICROSCOPIC
Bilirubin Urine: NEGATIVE
Glucose, UA: NEGATIVE mg/dL
Hgb urine dipstick: NEGATIVE
Ketones, ur: NEGATIVE mg/dL
Leukocytes,Ua: NEGATIVE
Nitrite: NEGATIVE
Protein, ur: NEGATIVE mg/dL
Specific Gravity, Urine: >1.046 — ABNORMAL HIGH (ref 1.005–1.030)
pH: 7 (ref 5.0–8.0)

## 2022-10-21 LAB — CBC
HCT: 47.1 % (ref 39.0–52.0)
Hemoglobin: 16.7 g/dL (ref 13.0–17.0)
MCH: 30.9 pg (ref 26.0–34.0)
MCHC: 35.5 g/dL (ref 30.0–36.0)
MCV: 87.2 fL (ref 80.0–100.0)
Platelets: 186 10*3/uL (ref 150–400)
RBC: 5.4 MIL/uL (ref 4.22–5.81)
RDW: 12.5 % (ref 11.5–15.5)
WBC: 9.3 10*3/uL (ref 4.0–10.5)
nRBC: 0 % (ref 0.0–0.2)

## 2022-10-21 LAB — LIPID PANEL
Cholesterol: 76 mg/dL (ref 0–200)
HDL: 33 mg/dL — ABNORMAL LOW (ref >40)
LDL Cholesterol: 35 mg/dL (ref 0–99)
Total CHOL/HDL Ratio: 2.3 RATIO
Triglycerides: 41 mg/dL (ref <150)
VLDL: 8 mg/dL (ref 0–40)

## 2022-10-21 LAB — COMPREHENSIVE METABOLIC PANEL
ALT: 58 U/L — ABNORMAL HIGH (ref 0–44)
AST: 35 U/L (ref 15–41)
Albumin: 4.3 g/dL (ref 3.5–5.0)
Alkaline Phosphatase: 56 U/L (ref 38–126)
Anion gap: 8 (ref 5–15)
BUN: 12 mg/dL (ref 6–20)
CO2: 26 mmol/L (ref 22–32)
Calcium: 8.7 mg/dL — ABNORMAL LOW (ref 8.9–10.3)
Chloride: 103 mmol/L (ref 98–111)
Creatinine, Ser: 1.09 mg/dL (ref 0.61–1.24)
GFR, Estimated: >60 mL/min (ref >60)
Glucose, Bld: 115 mg/dL — ABNORMAL HIGH (ref 70–99)
Potassium: 3.7 mmol/L (ref 3.5–5.1)
Sodium: 137 mmol/L (ref 135–145)
Total Bilirubin: 1 mg/dL (ref 0.3–1.2)
Total Protein: 7.5 g/dL (ref 6.5–8.1)

## 2022-10-21 LAB — TROPONIN I (HIGH SENSITIVITY): Troponin I (High Sensitivity): <2 ng/L (ref <18)

## 2022-10-21 LAB — LIPASE, BLOOD: Lipase: 194 U/L — ABNORMAL HIGH (ref 11–51)

## 2022-10-21 MED ORDER — ONDANSETRON HCL 4 MG/2ML IJ SOLN
4.0000 mg | Freq: Four times a day (QID) | INTRAMUSCULAR | Status: DC | PRN
  Administered 2022-10-22 (×2): 4 mg via INTRAVENOUS
  Filled 2022-10-21 (×2): qty 2

## 2022-10-21 MED ORDER — TRAZODONE HCL 50 MG PO TABS: 50.0000 mg | ORAL_TABLET | Freq: Every evening | ORAL | Status: DC | PRN

## 2022-10-21 MED ORDER — MORPHINE SULFATE (PF) 2 MG/ML IV SOLN: 2.0000 mg | INTRAVENOUS | Status: DC | PRN

## 2022-10-21 MED ORDER — LIDOCAINE VISCOUS HCL 2 % MT SOLN
15.0000 mL | Freq: Four times a day (QID) | OROMUCOSAL | Status: DC | PRN
  Administered 2022-10-21: 15 mL via OROMUCOSAL
  Filled 2022-10-21: qty 15

## 2022-10-21 MED ORDER — POTASSIUM CHLORIDE 2 MEQ/ML IV SOLN
INTRAVENOUS | Status: DC
  Filled 2022-10-21 (×8): qty 1000

## 2022-10-21 MED ORDER — PANTOPRAZOLE SODIUM 20 MG PO TBEC
40.0000 mg | DELAYED_RELEASE_TABLET | Freq: Every day | ORAL | Status: DC
  Administered 2022-10-22 – 2022-10-23 (×2): 40 mg via ORAL
  Filled 2022-10-21 (×2): qty 2

## 2022-10-21 MED ORDER — IOHEXOL 300 MG/ML  SOLN
100.0000 mL | Freq: Once | INTRAMUSCULAR | Status: AC | PRN
  Administered 2022-10-21: 100 mL via INTRAVENOUS

## 2022-10-21 MED ORDER — PNEUMOCOCCAL VAC POLYVALENT 25 MCG/0.5ML IJ INJ
0.5000 mL | INJECTION | INTRAMUSCULAR | Status: AC
  Administered 2022-10-22: 0.5 mL via INTRAMUSCULAR
  Filled 2022-10-21: qty 0.5

## 2022-10-21 MED ORDER — INFLUENZA VAC SPLIT QUAD 0.5 ML IM SUSY
0.5000 mL | PREFILLED_SYRINGE | INTRAMUSCULAR | Status: AC
  Administered 2022-10-22: 0.5 mL via INTRAMUSCULAR
  Filled 2022-10-21: qty 0.5

## 2022-10-21 MED ORDER — POLYETHYLENE GLYCOL 3350 17 G PO PACK: 17.0000 g | PACK | Freq: Every day | ORAL | Status: DC | PRN

## 2022-10-21 MED ORDER — ONDANSETRON HCL 4 MG PO TABS: 4.0000 mg | ORAL_TABLET | Freq: Four times a day (QID) | ORAL | Status: DC | PRN

## 2022-10-21 MED ORDER — OXYCODONE HCL 5 MG PO TABS
5.0000 mg | ORAL_TABLET | ORAL | Status: DC | PRN
  Administered 2022-10-22: 5 mg via ORAL
  Filled 2022-10-21: qty 1

## 2022-10-21 MED ORDER — ENOXAPARIN SODIUM 60 MG/0.6ML IJ SOSY
0.5000 mg/kg | PREFILLED_SYRINGE | INTRAMUSCULAR | Status: DC
  Administered 2022-10-22 – 2022-10-23 (×2): 52.5 mg via SUBCUTANEOUS
  Filled 2022-10-21 (×2): qty 0.6

## 2022-10-21 MED ORDER — ALUM & MAG HYDROXIDE-SIMETH 200-200-20 MG/5ML PO SUSP
30.0000 mL | Freq: Four times a day (QID) | ORAL | Status: DC | PRN
  Administered 2022-10-21 – 2022-10-22 (×2): 30 mL via ORAL
  Filled 2022-10-21 (×2): qty 30

## 2022-10-21 MED ORDER — ONDANSETRON HCL 4 MG/2ML IJ SOLN
4.0000 mg | Freq: Once | INTRAMUSCULAR | Status: AC
  Administered 2022-10-21: 4 mg via INTRAVENOUS
  Filled 2022-10-21: qty 2

## 2022-10-21 MED ORDER — SODIUM CHLORIDE 0.9 % IV BOLUS
1000.0000 mL | Freq: Once | INTRAVENOUS | Status: AC
  Administered 2022-10-21: 1000 mL via INTRAVENOUS

## 2022-10-21 MED ORDER — MORPHINE SULFATE (PF) 4 MG/ML IV SOLN
4.0000 mg | Freq: Once | INTRAVENOUS | Status: AC
  Administered 2022-10-21: 4 mg via INTRAVENOUS
  Filled 2022-10-21: qty 1

## 2022-10-21 NOTE — ED Provider Notes (Signed)
[facility] Provider Note  Patient Contact: 6:58 PM (approximate)   History   Chest Pain   HPI  Brent [name] is a 37 y.o. male with a history of ulcerative colitis, pancreatic lesion, GERD and tobacco use, presents to the emergency department with epigastric abdominal pain, nausea, vomiting and diarrhea for the past several days.  No prior history of pancreatitis.  Patient denies chest tightness or shortness of breath.  No fever at home.  Patient denies alcohol usage or history of diabetes.      Physical Exam   Triage Vital Signs: ED Triage Vitals  Enc Vitals Group     BP 10/21/22 1707 (!) 122/106     Pulse Rate 10/21/22 1707 (!) 115     Resp 10/21/22 1707 18     Temp 10/21/22 1707 99 F (37.2 C)     Temp Source 10/21/22 1707 Oral     SpO2 10/21/22 1707 95 %     Weight --      Height --      Head Circumference --      Peak Flow --      Pain Score 10/21/22 1703 7     Pain Loc --      Pain Edu? --      Excl. in [place]? --     Most recent vital signs: Vitals:   10/21/22 1937 10/21/22 2135  BP: 134/82 (!) 142/84  Pulse: 77 67  Resp: 18 20  Temp:  97.8 F (36.6 C)  SpO2: 97% 93%     General: Alert and in no acute distress. Eyes:  PERRL. EOMI. Head: No acute traumatic findings ENT:      Nose: No congestion/rhinnorhea.      Mouth/Throat: Mucous membranes are moist. Neck: No stridor. No cervical spine tenderness to palpation. Cardiovascular:  Good peripheral perfusion Respiratory: Normal respiratory effort without tachypnea or retractions. Lungs CTAB. Good air entry to the bases with no decreased or absent breath sounds. Gastrointestinal: Bowel sounds 4 quadrants. Soft and nontender to palpation. No guarding or rigidity. No palpable masses. No distention. No CVA tenderness. Musculoskeletal: Full range of motion to all extremities.  Neurologic:  No gross focal neurologic deficits are appreciated.  Skin:   No rash noted   ED Results /  Procedures / Treatments   Labs (all labs ordered are listed, but only abnormal results are displayed) Labs Reviewed  LIPASE, BLOOD - Abnormal; Notable for the following components:      Result Value   Lipase 194 (*)    All other components within normal limits  COMPREHENSIVE METABOLIC PANEL - Abnormal; Notable for the following components:   Glucose, Bld 115 (*)    Calcium 8.7 (*)    ALT 58 (*)    All other components within normal limits  URINALYSIS, ROUTINE W REFLEX MICROSCOPIC - Abnormal; Notable for the following components:   Color, Urine YELLOW (*)    APPearance CLEAR (*)    Specific Gravity, Urine >1.046 (*)    All other components within normal limits  LIPID PANEL - Abnormal; Notable for the following components:   HDL 33 (*)    All other components within normal limits  CBC  TROPONIN I (HIGH SENSITIVITY)  TROPONIN I (HIGH SENSITIVITY)     EKG  Sinus tachycardia with no ST segment elevation or other apparent arrhythmia.   RADIOLOGY  I personally viewed and evaluated these images as part of my medical decision making, as well as  reviewing the written report by the radiologist.  ED Provider Interpretation: CT abdomen pelvis consistent with acute pancreatitis and severe hepatic steatosis.  Gallstones present.   PROCEDURES:  Critical Care performed: No  Procedures   MEDICATIONS ORDERED IN ED: Medications  sodium chloride 0.9 % bolus 1,000 mL (0 mLs Intravenous Stopped 10/21/22 1858)  iohexol (OMNIPAQUE) 300 MG/ML solution 100 mL (100 mLs Intravenous Contrast Given 10/21/22 1805)  sodium chloride 0.9 % bolus 1,000 mL (0 mLs Intravenous Stopped 10/21/22 2105)  morphine (PF) 4 MG/ML injection 4 mg (4 mg Intravenous Given 10/21/22 2100)  ondansetron (ZOFRAN) injection 4 mg (4 mg Intravenous Given 10/21/22 2103)     IMPRESSION / MDM / ASSESSMENT AND PLAN / ED COURSE  I reviewed the triage vital signs and the nursing notes.                               Assessment and plan Abdominal pain 37 year old male presents to the emergency department with epigastric abdominal pain for the past several days.  Patient was tachycardic at triage but vital signs were otherwise reassuring.  On exam, patient was alert and nontoxic-appearing.  Patient's lipase elevated at 194.  CBC within range.  Patient CMP indicated mild elevation of ALT but was otherwise reassuring.  Troponin within range.  Urinalysis shows no signs of UTI  CT abdomen pelvis consistent with acute pancreatitis.  No common bile duct dilation on dedicated ultrasound.  Patient given 2 L of normal saline and morphine for pain.  Patient was accepted to the hospitalist service under the care of Dr. Clayton Lefort.      FINAL CLINICAL IMPRESSION(S) / ED DIAGNOSES   Final diagnoses:  Acute pancreatitis, unspecified complication status, unspecified pancreatitis type     Rx / DC Orders   ED Discharge Orders     None        Note:  This document was prepared using Dragon voice recognition software and may include unintentional dictation errors.   Vallarie Mare [place], Hershal Coria 10/21/22 2141    Harvest Dark, MD 10/22/22 [phone]

## 2022-10-21 NOTE — ED Triage Notes (Signed)
Pt c/o epigastric pain x3 days that is worse after eating. Pt reports N/V/D, denies SHOB and dizziness.

## 2022-10-21 NOTE — H&P (Signed)
History and Physical    Patient: Brent [name] OEV:[id number] DOB: 23-Feb-1986 DOA: 10/21/2022 DOS: the patient was seen and examined on 10/21/2022 PCP: [facility]  Patient coming from: Home  Chief Complaint:  Chief Complaint  Patient presents with   Chest Pain   HPI: Brent [name] is a 37 y.o. [name] with medical history significant for ulcerative pancolitis who presents with abdominal pain.  Patient reports that he is been having nausea, vomiting, upper abdominal pain for about the past week.  Inciting event was eating a [employer] cheeseburger last weekend and since then he has had persistence of the above symptoms.  He has been unable to eat a full meal without having excruciating abdominal pain.  He initially thought he was having a flare of his colitis but pain was not in the usual place in his lower abdomen.  In the ED vital signs notable for mild hypertension otherwise normal.  Labs showed unremarkable CBC, CMP with mildly elevated ALT, lipase 194.  CT abdomen and pelvis was obtained which showed findings concerning for acute pancreatitis, severe hepatic steatosis, gallstones, and signs of mild colitis.  Right upper quadrant ultrasound showed similar findings.  He was given IV fluids, morphine, Zofran, and admitted for further management.    Review of Systems: As mentioned in the history of present illness. All other systems reviewed and are negative. History reviewed. No pertinent past medical history. Past Surgical History:  Procedure Laterality Date   COLONOSCOPY WITH PROPOFOL N/A 01/01/2021   Procedure: COLONOSCOPY WITH PROPOFOL;  Surgeon: Toney Reil, MD;  Location: [facility] ENDOSCOPY;  Service: Gastroenterology;  Laterality: N/A;   TYMPANOSTOMY TUBE PLACEMENT     Social History:  reports that he has never smoked. He has never used smokeless tobacco. He reports that he does not currently use alcohol. He reports that he does not currently use  drugs.  Allergies  Allergen Reactions   Wasp Venom Protein Shortness Of Breath    History reviewed. No pertinent family history.  Prior to Admission medications   Medication Sig Start Date End Date Taking? Authorizing Provider  amphetamine-dextroamphetamine (ADDERALL XR) 15 MG 24 hr capsule Take 15 mg by mouth every morning. 12/23/20   [provider]  buPROPion (WELLBUTRIN XL) 150 MG 24 hr tablet Take 450 mg by mouth daily. 12/01/20   [provider]  calcium carbonate (TUMS - DOSED IN MG ELEMENTAL CALCIUM) 500 MG chewable tablet Chew 1 tablet by mouth daily.    [provider]    Physical Exam: Vitals:   10/21/22 1707 10/21/22 1937 10/21/22 2135  BP: (!) 122/106 134/82 (!) 142/84  Pulse: (!) 115 77 67  Resp: 18 18 20   Temp: 99 F (37.2 C)  97.8 F (36.6 C)  TempSrc: Oral  Oral  SpO2: 95% 97% 93%   Physical Exam Vitals reviewed.  Constitutional:      General: He is not in acute distress.    Appearance: He is well-developed. He is not ill-appearing, toxic-appearing or diaphoretic.  HENT:     Head: Normocephalic and atraumatic.  Cardiovascular:     Rate and Rhythm: Normal rate and regular rhythm.     Heart sounds: Normal heart sounds.  Pulmonary:     Effort: Pulmonary effort is normal.     Breath sounds: Normal breath sounds.  Abdominal:     Palpations: Abdomen is soft.     Tenderness: There is no guarding or rebound.     Comments: Soft abdomen, diffusely  hypoactive bowel sounds, very mild tenderness to palpation in the epigastrium but otherwise nontender  Skin:    General: Skin is warm and dry.  Neurological:     General: No focal deficit present.     Mental Status: He is alert.  Psychiatric:        Mood and Affect: Mood normal.        Behavior: Behavior normal.      Data Reviewed: Results for orders placed or performed during the hospital encounter of 10/21/22 (from the past 24 hour(s))  Lipase, blood     Status: Abnormal    Collection Time: 10/21/22  5:00 PM  Result Value Ref Range   Lipase 194 (H) 11 - 51 U/L  Comprehensive metabolic panel     Status: Abnormal   Collection Time: 10/21/22  5:00 PM  Result Value Ref Range   Sodium 137 135 - 145 mmol/L   Potassium 3.7 3.5 - 5.1 mmol/L   Chloride 103 98 - 111 mmol/L   CO2 26 22 - 32 mmol/L   Glucose, Bld 115 (H) 70 - 99 mg/dL   BUN 12 6 - 20 mg/dL   Creatinine, Ser 1.09 0.61 - 1.24 mg/dL   Calcium 8.7 (L) 8.9 - 10.3 mg/dL   Total Protein 7.5 6.5 - 8.1 g/dL   Albumin 4.3 3.5 - 5.0 g/dL   AST 35 15 - 41 U/L   ALT 58 (H) 0 - 44 U/L   Alkaline Phosphatase 56 38 - 126 U/L   Total Bilirubin 1.0 0.3 - 1.2 mg/dL   GFR, Estimated >60 >60 mL/min   Anion gap 8 5 - 15  CBC     Status: None   Collection Time: 10/21/22  5:00 PM  Result Value Ref Range   WBC 9.3 4.0 - 10.5 K/uL   RBC 5.40 4.22 - 5.81 MIL/uL   Hemoglobin 16.7 13.0 - 17.0 g/dL   HCT 47.1 39.0 - 52.0 %   MCV 87.2 80.0 - 100.0 fL   MCH 30.9 26.0 - 34.0 pg   MCHC 35.5 30.0 - 36.0 g/dL   RDW 12.5 11.5 - 15.5 %   Platelets 186 150 - 400 K/uL   nRBC 0.0 0.0 - 0.2 %  Troponin I (High Sensitivity)     Status: None   Collection Time: 10/21/22  5:00 PM  Result Value Ref Range   Troponin I (High Sensitivity) <2 <18 ng/L  Lipid panel     Status: Abnormal   Collection Time: 10/21/22  5:00 PM  Result Value Ref Range   Cholesterol 76 0 - 200 mg/dL   Triglycerides 41 <150 mg/dL   HDL 33 (L) >40 mg/dL   Total CHOL/HDL Ratio 2.3 RATIO   VLDL 8 0 - 40 mg/dL   LDL Cholesterol 35 0 - 99 mg/dL  Urinalysis, Routine w reflex microscopic     Status: Abnormal   Collection Time: 10/21/22  6:19 PM  Result Value Ref Range   Color, Urine YELLOW (A) YELLOW   APPearance CLEAR (A) CLEAR   Specific Gravity, Urine >1.046 (H) 1.005 - 1.030   pH 7.0 5.0 - 8.0   Glucose, UA NEGATIVE NEGATIVE mg/dL   Hgb urine dipstick NEGATIVE NEGATIVE   Bilirubin Urine NEGATIVE NEGATIVE   Ketones, ur NEGATIVE NEGATIVE mg/dL    Protein, ur NEGATIVE NEGATIVE mg/dL   Nitrite NEGATIVE NEGATIVE   Leukocytes,Ua NEGATIVE NEGATIVE   US Abdomen Limited RUQ (LIVER/GB) CLINICAL DATA:  Upper abdominal pain  EXAM: ULTRASOUND  ABDOMEN LIMITED RIGHT UPPER QUADRANT  COMPARISON:  CT today  FINDINGS: Gallbladder:  Multiple mobile stones in the gallbladder measuring up to 12 mm. No wall thickening or sonographic Murphy sign.  Common bile duct:  Diameter: Normal caliber, 4 mm.  Liver:  Increased echotexture compatible with fatty infiltration. No focal abnormality or biliary ductal dilatation. Portal vein is patent on color Doppler imaging with normal direction of blood flow towards the liver.  Other: None.  IMPRESSION: Hepatic steatosis.  Cholelithiasis.  No sonographic evidence of acute cholecystitis.  Electronically Signed   By: Charlett Nose M.D.   On: 10/21/2022 21:20 CT ABDOMEN PELVIS W CONTRAST CLINICAL DATA:  Epigastric pain  EXAM: CT ABDOMEN AND PELVIS WITH CONTRAST  TECHNIQUE: Multidetector CT imaging of the abdomen and pelvis was performed using the standard protocol following bolus administration of intravenous contrast.  RADIATION DOSE REDUCTION: This exam was performed according to the departmental dose-optimization program which includes automated exposure control, adjustment of the mA and/or kV according to patient size and/or use of iterative reconstruction technique.  CONTRAST:  OMNIPAQUE IOHEXOL 300 MG/ML  SOLN  COMPARISON:  CT 12/30/2020  FINDINGS: Lower chest: Lung bases demonstrate no acute airspace disease  Hepatobiliary: Severe hepatic steatosis. Gallstones. No biliary dilatation  Pancreas: Slight enlargement and inflammatory change involving the pancreatic head and uncinate process. No ductal dilatation  Spleen: Normal in size without focal abnormality.  Adrenals/Urinary Tract: Adrenal glands are unremarkable. Kidneys are normal, without renal calculi, focal  lesion, or hydronephrosis. Bladder is unremarkable.  Stomach/Bowel: The stomach is nonenlarged. No dilated small bowel. Negative appendix. Possible wall thickening involving the hepatic flexure, transverse colon, splenic flexure, descending and rectosigmoid colon.  Vascular/Lymphatic: No significant vascular findings are present. No enlarged abdominal or pelvic lymph nodes.  Reproductive: Prostate is unremarkable.  Other: No abdominal wall hernia or abnormality. No abdominopelvic ascites.  Musculoskeletal: No acute or significant osseous findings.  IMPRESSION: 1. Slight enlargement and inflammatory change involving the pancreatic head and uncinate process consistent with acute pancreatitis. 2. Severe hepatic steatosis. 3. Gallstones. 4. Possible wall thickening of the colon from the hepatic flexure to the rectosigmoid colon which may be due to mild colitis.  Electronically Signed   By: Jasmine Pang M.D.   On: 10/21/2022 18:29   Assessment and Plan: Brent [name] is a 37 y.o. [name] with medical history significant for ulcerative pancolitis who presents with abdominal pain, and is found to have pancreatitis of unclear etiology.   * Pancreatitis Given significant burden of cholelithiasis on imaging, mildly elevated LFT, suspect patient had a gallstone that has already passed as the etiology of his pancreatitis.  Other possibility to consider would be IgG4 disease, though from literature review does not appear to be any association with ulcerative colitis. - LR with 20 mill equivalents of potassium at 150 cc an hour - Antiemetics and IV pain medicines as needed - Clear liquid diet, discussed at length with patient that he should only eat once he feels hungry and to really take it easy - Consider curbside of GI in the a.m.      Advance Care Planning:   Code Status: Full Code , confirmed with patient  Consults: None  Family Communication: None  Severity of  Illness: The appropriate patient status for this patient is INPATIENT. Inpatient status is judged to be reasonable and necessary in order to provide the required intensity of service to ensure the patient's safety. The patient's presenting symptoms, physical exam findings, and initial radiographic  and laboratory data in the context of their chronic comorbidities is felt to place them at high risk for further clinical deterioration. Furthermore, it is not anticipated that the patient will be medically stable for discharge from the hospital within 2 midnights of admission.   * I certify that at the point of admission it is my clinical judgment that the patient will require inpatient hospital care spanning beyond 2 midnights from the point of admission due to high intensity of service, high risk for further deterioration and high frequency of surveillance required.*  Author: Clarnce Flock, MD 10/21/2022 10:11 PM  For on call review [email or web address].

## 2022-10-21 NOTE — Assessment & Plan Note (Signed)
Given significant burden of cholelithiasis on imaging, mildly elevated LFT, suspect patient had a gallstone that has already passed as the etiology of his pancreatitis.  Other possibility to consider would be IgG4 disease, though from literature review does not appear to be any association with ulcerative colitis. - LR with 20 mill equivalents of potassium at 150 cc an hour - Antiemetics and IV pain medicines as needed - Tolerating clear liquid-advance to full liquid.

## 2022-10-22 DIAGNOSIS — K8500 Idiopathic acute pancreatitis without necrosis or infection: Secondary | ICD-10-CM | POA: Diagnosis not present

## 2022-10-22 LAB — CBC
HCT: 42.2 % (ref 39.0–52.0)
Hemoglobin: 15.1 g/dL (ref 13.0–17.0)
MCH: 31.5 pg (ref 26.0–34.0)
MCHC: 35.8 g/dL (ref 30.0–36.0)
MCV: 87.9 fL (ref 80.0–100.0)
Platelets: 159 10*3/uL (ref 150–400)
RBC: 4.8 MIL/uL (ref 4.22–5.81)
RDW: 12.3 % (ref 11.5–15.5)
WBC: 7.8 10*3/uL (ref 4.0–10.5)
nRBC: 0 % (ref 0.0–0.2)

## 2022-10-22 LAB — COMPREHENSIVE METABOLIC PANEL
ALT: 47 U/L — ABNORMAL HIGH (ref 0–44)
AST: 32 U/L (ref 15–41)
Albumin: 3.6 g/dL (ref 3.5–5.0)
Alkaline Phosphatase: 49 U/L (ref 38–126)
Anion gap: 8 (ref 5–15)
BUN: 10 mg/dL (ref 6–20)
CO2: 23 mmol/L (ref 22–32)
Calcium: 8.4 mg/dL — ABNORMAL LOW (ref 8.9–10.3)
Chloride: 108 mmol/L (ref 98–111)
Creatinine, Ser: 0.99 mg/dL (ref 0.61–1.24)
GFR, Estimated: >60 mL/min (ref >60)
Glucose, Bld: 123 mg/dL — ABNORMAL HIGH (ref 70–99)
Potassium: 3.9 mmol/L (ref 3.5–5.1)
Sodium: 139 mmol/L (ref 135–145)
Total Bilirubin: 1 mg/dL (ref 0.3–1.2)
Total Protein: 6.4 g/dL — ABNORMAL LOW (ref 6.5–8.1)

## 2022-10-22 NOTE — Progress Notes (Signed)
Progress Note   Patient: Brent [name] [id number]:[id number] DOB: 07-25-1986 DOA: 10/21/2022     1 DOS: the patient was seen and examined on 10/22/2022   Brief hospital course: Taken from H&P.   Brent [name] is a 37 y.o. male with medical history significant for ulcerative pancolitis who presents with abdominal pain, nausea and vomiting for for about the past week.  His symptoms started after  Fahd Galea is a 37 y.o. male with medical history significant for ulcerative pancolitis who presents with abdominal pain.  No history of alcohol use.  ED course.  ED vital signs notable for mild hypertension otherwise normal.  Labs showed unremarkable CBC, CMP with mildly elevated ALT, lipase 194.  CT abdomen and pelvis was obtained which showed findings concerning for acute pancreatitis, severe hepatic steatosis, gallstones, and signs of mild colitis.  Right upper quadrant ultrasound showed similar findings.   1/13: Vitals normal, labs mostly grossly unremarkable with some improvement of ALT to 47. Patient is not on any treatment for ulcerative colitis for the past 1 year due to loss of insurance. No new medications, only takes over-the-counter omeprazole as needed for GERD. Currently tolerating clear liquid well, will advance to full liquid. Patient need to have outpatient follow-up with Dr. Marius Ditch from GI to resume his ulcerative colitis management.  He is currently working on his insurance.   Assessment and Plan: * Pancreatitis Given significant burden of cholelithiasis on imaging, mildly elevated LFT, suspect patient had a gallstone that has already passed as the etiology of his pancreatitis.  Other possibility to consider would be IgG4 disease, though from literature review does not appear to be any association with ulcerative colitis. - LR with 20 mill equivalents of potassium at 150 cc an hour - Antiemetics and IV pain medicines as needed - Tolerating clear liquid-advance to full  liquid.  Ulcerative colitis ([place]) Patient need to follow-up with his GI Dr. Ronalee Red as outpatient to resume management of his ulcerative colitis.  Currently no diarrhea, abdominal pain or bleeding. -Supportive care  Gastroesophageal reflux disease without esophagitis -PPI  BMI 31.0-31.9,adult Estimated body mass index is 33.09 kg/m as calculated from the following:   Height as of this encounter: 5\' 10"  (1.778 m).   Weight as of this encounter: 104.6 kg.   -Patient is overweight , counseling was provided.    Subjective: Patient was seen and examined today.  Nausea and vomiting has improved.  Some epigastric discomfort but stating that this pain is much better than before.  No diarrhea or bleeding per rectum.  Patient has stopped taking his ulcerative colitis treatment due to insurance issues.  Advised to resume and follow-up with his GI.  Physical Exam: Vitals:   10/21/22 2135 10/21/22 2237 10/21/22 2309 10/22/22 0741  BP: (!) 142/84 (!) 142/87 (!) 127/98 125/83  Pulse: 67 84 77 76  Resp: 20 14 19 18   Temp: 97.8 F (36.6 C)  97.8 F (36.6 C) 98.4 F (36.9 C)  TempSrc: Oral     SpO2: 93% 96% 96% 96%  Weight:   104.6 kg   Height:   5\' 10"  (1.778 m)    General.  Overweight gentleman, in no acute distress. Pulmonary.  Lungs clear bilaterally, normal respiratory effort. CV.  Regular rate and rhythm, no JVD, rub or murmur. Abdomen.  Soft, nontender, nondistended, BS positive. CNS.  Alert and oriented .  No focal neurologic deficit. Extremities.  No edema, no cyanosis, pulses intact and symmetrical. Psychiatry.  Judgment and insight appears normal.  Data Reviewed: Prior data reviewed  Family Communication: Discussed with patient  Disposition: Status is: Inpatient Remains inpatient appropriate because: Severity of illness  Planned Discharge Destination: Home  Time spent: 43 minutes  This record has been created using Systems analyst. Errors have been  sought and corrected,but may not always be located. Such creation errors do not reflect on the standard of care.   Author: Lorella Nimrod, MD 10/22/2022 1:19 PM  For on call review [email or web address].

## 2022-10-22 NOTE — Hospital Course (Addendum)
Taken from H&P.   Brent [name] is a 37 y.o. male with medical history significant for ulcerative pancolitis who presents with abdominal pain, nausea and vomiting for for about the past week.  His symptoms started after  Brent [name] is a 37 y.o. male with medical history significant for ulcerative pancolitis who presents with abdominal pain.  No history of alcohol use.  ED course.  ED vital signs notable for mild hypertension otherwise normal.  Labs showed unremarkable CBC, CMP with mildly elevated ALT, lipase 194.  CT abdomen and pelvis was obtained which showed findings concerning for acute pancreatitis, severe hepatic steatosis, gallstones, and signs of mild colitis.  Right upper quadrant ultrasound showed similar findings.   1/13: Vitals normal, labs mostly grossly unremarkable with some improvement of ALT to 47. Patient is not on any treatment for ulcerative colitis for the past 1 year due to loss of insurance. No new medications, only takes over-the-counter omeprazole as needed for GERD. Currently tolerating clear liquid well, will advance to full liquid. Patient need to have outpatient follow-up with Dr. Marius Ditch from GI to resume his ulcerative colitis management.  He is currently working on his insurance.  1/14: Patient continued to improve.  Vital stable.  Able to tolerate soft diet, abdominal pain improved.  No more nausea or vomiting.  Patient was again instructed to follow-up with his gastroenterologist to resume care for his ulcerative colitis which currently appears to be in remission. He can take omeprazole daily for next few days followed by as needed as he was doing it before. Patient need to have a close follow-up with his providers for further recommendations.

## 2022-10-22 NOTE — Progress Notes (Signed)
PHARMACIST - PHYSICIAN COMMUNICATION  CONCERNING:  Enoxaparin (Lovenox) for DVT Prophylaxis    RECOMMENDATION: Patient was prescribed enoxaprin 40mg  q24 hours for VTE prophylaxis.   Filed Weights   10/21/22 2309  Weight: 104.6 kg (230 lb 9.6 oz)    Body mass index is 33.09 kg/m.  Estimated Creatinine Clearance: 115.6 mL/min (by C-G formula based on SCr of 1.09 mg/dL).   Based on [place] patient is candidate for enoxaparin 0.5mg /kg TBW SQ every 24 hours based on BMI being >30.  DESCRIPTION: Pharmacy has adjusted enoxaparin dose per [facility] policy.  Patient is now receiving enoxaparin 0.5 mg/kg every 24 hours   Renda Rolls, PharmD, [facility] 10/22/2022 12:40 AM

## 2022-10-22 NOTE — Assessment & Plan Note (Signed)
Patient need to follow-up with his GI Dr. Ronalee Red as outpatient to resume management of his ulcerative colitis.  Currently no diarrhea, abdominal pain or bleeding. -Supportive care

## 2022-10-22 NOTE — Assessment & Plan Note (Signed)
Estimated body mass index is 33.09 kg/m as calculated from the following:   Height as of this encounter: 5\' 10"  (1.778 m).   Weight as of this encounter: 104.6 kg.   -Patient is overweight , counseling was provided.

## 2022-10-22 NOTE — Plan of Care (Signed)

## 2022-10-22 NOTE — Assessment & Plan Note (Signed)
-  PPI

## 2022-10-23 DIAGNOSIS — K8500 Idiopathic acute pancreatitis without necrosis or infection: Secondary | ICD-10-CM | POA: Diagnosis not present

## 2022-10-23 DIAGNOSIS — K219 Gastro-esophageal reflux disease without esophagitis: Secondary | ICD-10-CM | POA: Diagnosis not present

## 2022-10-23 NOTE — Discharge Summary (Signed)
Physician Discharge Summary   Patient: Brent [name] MRN: [id number] DOB: 09-15-1986  Admit date:     10/21/2022  Discharge date: 10/23/22  Discharge Physician: Lorella Nimrod   PCP: [place], [place]   Recommendations at discharge:  Follow-up with primary care provider Follow-up with gastroenterology  Discharge Diagnoses: Principal Problem:   Pancreatitis Active Problems:   Ulcerative colitis ([place])   BMI 31.0-31.9,adult   Gastroesophageal reflux disease without esophagitis   Hospital Course: Taken from H&P.   Brent [name] is a 37 y.o. male with medical history significant for ulcerative pancolitis who presents with abdominal pain, nausea and vomiting for for about the past week.  His symptoms started after  Brent [name] is a 37 y.o. male with medical history significant for ulcerative pancolitis who presents with abdominal pain.  No history of alcohol use.  ED course.  ED vital signs notable for mild hypertension otherwise normal.  Labs showed unremarkable CBC, CMP with mildly elevated ALT, lipase 194.  CT abdomen and pelvis was obtained which showed findings concerning for acute pancreatitis, severe hepatic steatosis, gallstones, and signs of mild colitis.  Right upper quadrant ultrasound showed similar findings.   1/13: Vitals normal, labs mostly grossly unremarkable with some improvement of ALT to 47. Patient is not on any treatment for ulcerative colitis for the past 1 year due to loss of insurance. No new medications, only takes over-the-counter omeprazole as needed for GERD. Currently tolerating clear liquid well, will advance to full liquid. Patient need to have outpatient follow-up with Dr. Marius Ditch from GI to resume his ulcerative colitis management.  He is currently working on his insurance.  1/14: Patient continued to improve.  Vital stable.  Able to tolerate soft diet, abdominal pain improved.  No more nausea or vomiting.  Patient was again  instructed to follow-up with his gastroenterologist to resume care for his ulcerative colitis which currently appears to be in remission. He can take omeprazole daily for next few days followed by as needed as he was doing it before. Patient need to have a close follow-up with his providers for further recommendations.  Assessment and Plan: * Pancreatitis Given significant burden of cholelithiasis on imaging, mildly elevated LFT, suspect patient had a gallstone that has already passed as the etiology of his pancreatitis.  Other possibility to consider would be IgG4 disease, though from literature review does not appear to be any association with ulcerative colitis. - LR with 20 mill equivalents of potassium at 150 cc an hour - Antiemetics and IV pain medicines as needed - Tolerating clear liquid-advance to full liquid.  Ulcerative colitis ([place]) Patient need to follow-up with his GI Dr. Ronalee Red as outpatient to resume management of his ulcerative colitis.  Currently no diarrhea, abdominal pain or bleeding. -Supportive care  Gastroesophageal reflux disease without esophagitis -PPI  BMI 31.0-31.9,adult Estimated body mass index is 33.09 kg/m as calculated from the following:   Height as of this encounter: 5\' 10"  (1.778 m).   Weight as of this encounter: 104.6 kg.   -Patient is overweight , counseling was provided.   Consultants: None Procedures performed: None Disposition: Home Diet recommendation:  Discharge Diet Orders (From admission, onward)     Start     Ordered   10/23/22 0000  Diet - low sodium heart healthy        10/23/22 1035           Regular diet DISCHARGE MEDICATION: Allergies as of 10/23/2022  Reactions   Wasp Venom Protein Shortness Of Breath        Medication List     TAKE these medications    omeprazole 20 MG tablet Commonly known as: PRILOSEC OTC Take 40 mg by mouth daily.        Follow-up [place], [place]. Schedule an appointment as soon as possible for a visit in 1 week(s).   Contact information: [address] [place] [address] [phone]         Lin Landsman, MD. Schedule an appointment as soon as possible for a visit in 1 week(s).   Specialty: Gastroenterology Contact information: [place] [address] [phone]                Discharge Exam: Danley Danker Weights   10/21/22 2309  Weight: 104.6 kg   General.  Overweight gentleman, in no acute distress. Pulmonary.  Lungs clear bilaterally, normal respiratory effort. CV.  Regular rate and rhythm, no JVD, rub or murmur. Abdomen.  Soft, nontender, nondistended, BS positive. CNS.  Alert and oriented .  No focal neurologic deficit. Extremities.  No edema, no cyanosis, pulses intact and symmetrical. Psychiatry.  Judgment and insight appears normal.   Condition at discharge: stable  The results of significant diagnostics from this hospitalization (including imaging, microbiology, ancillary and laboratory) are listed below for reference.   Imaging Studies: US Abdomen Limited RUQ (LIVER/GB)  Result Date: 10/21/2022 CLINICAL DATA:  Upper abdominal pain EXAM: ULTRASOUND ABDOMEN LIMITED RIGHT UPPER QUADRANT COMPARISON:  CT today FINDINGS: Gallbladder: Multiple mobile stones in the gallbladder measuring up to 12 mm. No wall thickening or sonographic Murphy sign. Common bile duct: Diameter: Normal caliber, 4 mm. Liver: Increased echotexture compatible with fatty infiltration. No focal abnormality or biliary ductal dilatation. Portal vein is patent on color Doppler imaging with normal direction of blood flow towards the liver. Other: None. IMPRESSION: Hepatic steatosis. Cholelithiasis.  No sonographic evidence of acute cholecystitis. Electronically Signed   By: Rolm Baptise M.D.   On: 10/21/2022 21:20   CT ABDOMEN PELVIS W CONTRAST  Result Date: 10/21/2022 CLINICAL DATA:  Epigastric pain EXAM: CT  ABDOMEN AND PELVIS WITH CONTRAST TECHNIQUE: Multidetector CT imaging of the abdomen and pelvis was performed using the standard protocol following bolus administration of intravenous contrast. RADIATION DOSE REDUCTION: This exam was performed according to the departmental dose-optimization program which includes automated exposure control, adjustment of the mA and/or kV according to patient size and/or use of iterative reconstruction technique. CONTRAST:  173mL OMNIPAQUE IOHEXOL 300 MG/ML  SOLN COMPARISON:  CT 12/30/2020 FINDINGS: Lower chest: Lung bases demonstrate no acute airspace disease Hepatobiliary: Severe hepatic steatosis. Gallstones. No biliary dilatation Pancreas: Slight enlargement and inflammatory change involving the pancreatic head and uncinate process. No ductal dilatation Spleen: Normal in size without focal abnormality. Adrenals/Urinary Tract: Adrenal glands are unremarkable. Kidneys are normal, without renal calculi, focal lesion, or hydronephrosis. Bladder is unremarkable. Stomach/Bowel: The stomach is nonenlarged. No dilated small bowel. Negative appendix. Possible wall thickening involving the hepatic flexure, transverse colon, splenic flexure, descending and rectosigmoid colon. Vascular/Lymphatic: No significant vascular findings are present. No enlarged abdominal or pelvic lymph nodes. Reproductive: Prostate is unremarkable. Other: No abdominal wall hernia or abnormality. No abdominopelvic ascites. Musculoskeletal: No acute or significant osseous findings. IMPRESSION: 1. Slight enlargement and inflammatory change involving the pancreatic head and uncinate process consistent with acute pancreatitis. 2. Severe hepatic steatosis. 3. Gallstones. 4.  Possible wall thickening of the colon from the hepatic flexure to the rectosigmoid colon which may be due to mild colitis. Electronically Signed   By: Jasmine Pang M.D.   On: 10/21/2022 18:29    Microbiology: Results for orders placed or performed  during the hospital encounter of 12/30/20  Resp Panel by RT-PCR (Flu A&B, Covid) Nasopharyngeal Swab     Status: None   Collection Time: 12/30/20  5:35 PM   Specimen: Nasopharyngeal Swab; Nasopharyngeal(NP) swabs in vial transport medium  Result Value Ref Range Status   SARS Coronavirus 2 by RT PCR NEGATIVE NEGATIVE Final    Comment: (NOTE) SARS-CoV-2 target nucleic acids are NOT DETECTED.  The SARS-CoV-2 RNA is generally detectable in upper respiratory specimens during the acute phase of infection. The lowest concentration of SARS-CoV-2 viral copies this assay can detect is 138 copies/mL. A negative result does not preclude SARS-Cov-2 infection and should not be used as the sole basis for treatment or other patient management decisions. A negative result may occur with  improper specimen collection/handling, submission of specimen other than nasopharyngeal swab, presence of viral mutation(s) within the areas targeted by this assay, and inadequate number of viral copies(<138 copies/mL). A negative result must be combined with clinical observations, patient history, and epidemiological information. The expected result is Negative.  Fact Sheet for Patients:  BloggerCourse.com  Fact Sheet for Healthcare Providers:  SeriousBroker.it  This test is no t yet approved or cleared by the [place] FDA and  has been authorized for detection and/or diagnosis of SARS-CoV-2 by FDA under an Emergency Use Authorization (EUA). This EUA will remain  in effect (meaning this test can be used) for the duration of the COVID-19 declaration under Section 564(b)(1) of the Act, 21 U.S.C.section 360bbb-3(b)(1), unless the authorization is terminated  or revoked sooner.       Influenza A by PCR NEGATIVE NEGATIVE Final   Influenza B by PCR NEGATIVE NEGATIVE Final    Comment: (NOTE) The Xpert Xpress SARS-CoV-2/FLU/RSV plus assay is intended as an  aid in the diagnosis of influenza from Nasopharyngeal swab specimens and should not be used as a sole basis for treatment. Nasal washings and aspirates are unacceptable for Xpert Xpress SARS-CoV-2/FLU/RSV testing.  Fact Sheet for Patients: BloggerCourse.com  Fact Sheet for Healthcare Providers: SeriousBroker.it  This test is not yet approved or cleared by the [place] FDA and has been authorized for detection and/or diagnosis of SARS-CoV-2 by FDA under an Emergency Use Authorization (EUA). This EUA will remain in effect (meaning this test can be used) for the duration of the COVID-19 declaration under Section 564(b)(1) of the Act, 21 U.S.C. section 360bbb-3(b)(1), unless the authorization is terminated or revoked.  Performed at [facility], [address] [place], [place] [address]   C Difficile Quick Screen w PCR reflex     Status: None   Collection Time: 12/30/20  5:35 PM   Specimen: STOOL  Result Value Ref Range Status   C Diff antigen NEGATIVE NEGATIVE Final   C Diff toxin NEGATIVE NEGATIVE Final   C Diff interpretation No C. difficile detected.  Final    Comment: Performed at [facility], [address] [place], [place] [address]  Gastrointestinal Panel by PCR , Stool     Status: None   Collection Time: 12/30/20  5:35 PM   Specimen: STOOL  Result Value Ref Range Status   Campylobacter species NOT DETECTED NOT DETECTED Final   Plesimonas shigelloides NOT DETECTED NOT DETECTED  Final   Salmonella species NOT DETECTED NOT DETECTED Final   Yersinia enterocolitica NOT DETECTED NOT DETECTED Final   Vibrio species NOT DETECTED NOT DETECTED Final   Vibrio cholerae NOT DETECTED NOT DETECTED Final   Enteroaggregative E coli (EAEC) NOT DETECTED NOT DETECTED Final   Enteropathogenic E coli (EPEC) NOT DETECTED NOT DETECTED Final   Enterotoxigenic E coli (ETEC) NOT DETECTED NOT DETECTED Final   Shiga like  toxin producing E coli (STEC) NOT DETECTED NOT DETECTED Final   Shigella/Enteroinvasive E coli (EIEC) NOT DETECTED NOT DETECTED Final   Cryptosporidium NOT DETECTED NOT DETECTED Final   Cyclospora cayetanensis NOT DETECTED NOT DETECTED Final   Entamoeba histolytica NOT DETECTED NOT DETECTED Final   Giardia lamblia NOT DETECTED NOT DETECTED Final   Adenovirus F40/41 NOT DETECTED NOT DETECTED Final   Astrovirus NOT DETECTED NOT DETECTED Final   Norovirus GI/GII NOT DETECTED NOT DETECTED Final   Rotavirus A NOT DETECTED NOT DETECTED Final   Sapovirus (I, II, IV, and V) NOT DETECTED NOT DETECTED Final    Comment: Performed at [facility], [address] [place], [place] [address]    Labs: CBC: Recent Labs  Lab 10/21/22 1700 10/22/22 0608  WBC 9.3 7.8  HGB 16.7 15.1  HCT 47.1 42.2  MCV 87.2 87.9  PLT 186 295   Basic Metabolic Panel: Recent Labs  Lab 10/21/22 1700 10/22/22 0608  NA 137 139  K 3.7 3.9  CL 103 108  CO2 26 23  GLUCOSE 115* 123*  BUN 12 10  CREATININE 1.09 0.99  CALCIUM 8.7* 8.4*   Liver Function Tests: Recent Labs  Lab 10/21/22 1700 10/22/22 0608  AST 35 32  ALT 58* 47*  ALKPHOS 56 49  BILITOT 1.0 1.0  PROT 7.5 6.4*  ALBUMIN 4.3 3.6   CBG: No results for input(s): "GLUCAP" in the last 168 hours.  Discharge time spent: greater than 30 minutes.  This record has been created using Systems analyst. Errors have been sought and corrected,but may not always be located. Such creation errors do not reflect on the standard of care.   Signed: Lorella Nimrod, MD [facility] 10/23/2022

## 2022-10-23 NOTE — Plan of Care (Signed)

## 2023-02-16 ENCOUNTER — Emergency Department
Admission: EM | Admit: 2023-02-16 | Discharge: 2023-02-16 | Discharge status: Against Medical Advice | Payer: BC Managed Care – PPO | Attending: Emergency Medicine | Admitting: Emergency Medicine

## 2023-02-16 ENCOUNTER — Other Ambulatory Visit: Payer: Self-pay

## 2023-02-16 ENCOUNTER — Encounter: Payer: Self-pay | Admitting: Emergency Medicine

## 2023-02-16 DIAGNOSIS — R109 Unspecified abdominal pain: Secondary | ICD-10-CM | POA: Insufficient documentation

## 2023-02-16 DIAGNOSIS — R112 Nausea with vomiting, unspecified: Secondary | ICD-10-CM | POA: Insufficient documentation

## 2023-02-16 DIAGNOSIS — Z5321 Procedure and treatment not carried out due to patient leaving prior to being seen by health care provider: Secondary | ICD-10-CM | POA: Insufficient documentation

## 2023-02-16 LAB — CBC
HCT: 48.1 % (ref 39.0–52.0)
Hemoglobin: 17.6 g/dL — ABNORMAL HIGH (ref 13.0–17.0)
MCH: 31.4 pg (ref 26.0–34.0)
MCHC: 36.6 g/dL — ABNORMAL HIGH (ref 30.0–36.0)
MCV: 85.9 fL (ref 80.0–100.0)
Platelets: 209 10*3/uL (ref 150–400)
RBC: 5.6 MIL/uL (ref 4.22–5.81)
RDW: 12 % (ref 11.5–15.5)
WBC: 11.6 10*3/uL — ABNORMAL HIGH (ref 4.0–10.5)
nRBC: 0 % (ref 0.0–0.2)

## 2023-02-16 LAB — COMPREHENSIVE METABOLIC PANEL
ALT: 74 U/L — ABNORMAL HIGH (ref 0–44)
AST: 47 U/L — ABNORMAL HIGH (ref 15–41)
Albumin: 4.8 g/dL (ref 3.5–5.0)
Alkaline Phosphatase: 67 U/L (ref 38–126)
Anion gap: 9 (ref 5–15)
BUN: 16 mg/dL (ref 6–20)
CO2: 26 mmol/L (ref 22–32)
Calcium: 9.2 mg/dL (ref 8.9–10.3)
Chloride: 100 mmol/L (ref 98–111)
Creatinine, Ser: 0.89 mg/dL (ref 0.61–1.24)
GFR, Estimated: >60 mL/min (ref >60)
Glucose, Bld: 143 mg/dL — ABNORMAL HIGH (ref 70–99)
Potassium: 4.5 mmol/L (ref 3.5–5.1)
Sodium: 135 mmol/L (ref 135–145)
Total Bilirubin: 1.2 mg/dL (ref 0.3–1.2)
Total Protein: 8 g/dL (ref 6.5–8.1)

## 2023-02-16 LAB — LIPASE, BLOOD: Lipase: 31 U/L (ref 11–51)

## 2023-02-16 MED ORDER — ONDANSETRON 4 MG PO TBDP: 4.0000 mg | ORAL_TABLET | Freq: Once | ORAL | Status: DC | PRN

## 2023-02-16 NOTE — ED Triage Notes (Signed)
Patient to ED via POV for left sided abd pain since Monday worse today. Having vomiting and nausea. Unable to eat or drink. Hx of colonitis and pancreatitis

## 2023-02-16 NOTE — ED Notes (Signed)
Sent green and purple tubes to lab. Gave patient urine cup.

## 2023-06-12 ENCOUNTER — Emergency Department
Admission: EM | Admit: 2023-06-12 | Discharge: 2023-06-12 | Disposition: A | Discharge status: Home / Self Care | Payer: BC Managed Care – PPO | Attending: Emergency Medicine | Admitting: Emergency Medicine

## 2023-06-12 ENCOUNTER — Other Ambulatory Visit: Payer: Self-pay

## 2023-06-12 ENCOUNTER — Encounter: Payer: Self-pay | Admitting: Emergency Medicine

## 2023-06-12 ENCOUNTER — Emergency Department: Payer: BC Managed Care – PPO

## 2023-06-12 DIAGNOSIS — R197 Diarrhea, unspecified: Secondary | ICD-10-CM | POA: Diagnosis not present

## 2023-06-12 DIAGNOSIS — R112 Nausea with vomiting, unspecified: Secondary | ICD-10-CM | POA: Diagnosis not present

## 2023-06-12 DIAGNOSIS — R1011 Right upper quadrant pain: Secondary | ICD-10-CM | POA: Diagnosis not present

## 2023-06-12 DIAGNOSIS — R1012 Left upper quadrant pain: Secondary | ICD-10-CM | POA: Diagnosis not present

## 2023-06-12 DIAGNOSIS — K5190 Ulcerative colitis, unspecified, without complications: Secondary | ICD-10-CM | POA: Diagnosis not present

## 2023-06-12 DIAGNOSIS — R1032 Left lower quadrant pain: Secondary | ICD-10-CM | POA: Diagnosis not present

## 2023-06-12 DIAGNOSIS — K760 Fatty (change of) liver, not elsewhere classified: Secondary | ICD-10-CM | POA: Diagnosis not present

## 2023-06-12 HISTORY — DX: Ulcerative colitis, unspecified, without complications: K51.90

## 2023-06-12 LAB — CBC WITH DIFFERENTIAL/PLATELET
Abs Immature Granulocytes: 0.05 10*3/uL (ref 0.00–0.07)
Basophils Absolute: 0 10*3/uL (ref 0.0–0.1)
Basophils Relative: 0 %
Eosinophils Absolute: 0.2 10*3/uL (ref 0.0–0.5)
Eosinophils Relative: 2 %
HCT: 46.9 % (ref 39.0–52.0)
Hemoglobin: 16.9 g/dL (ref 13.0–17.0)
Immature Granulocytes: 1 %
Lymphocytes Relative: 21 %
Lymphs Abs: 2 10*3/uL (ref 0.7–4.0)
MCH: 30.7 pg (ref 26.0–34.0)
MCHC: 36 g/dL (ref 30.0–36.0)
MCV: 85.3 fL (ref 80.0–100.0)
Monocytes Absolute: 0.8 10*3/uL (ref 0.1–1.0)
Monocytes Relative: 8 %
Neutro Abs: 6.5 10*3/uL (ref 1.7–7.7)
Neutrophils Relative %: 68 %
Platelets: 202 10*3/uL (ref 150–400)
RBC: 5.5 MIL/uL (ref 4.22–5.81)
RDW: 11.8 % (ref 11.5–15.5)
WBC: 9.6 10*3/uL (ref 4.0–10.5)
nRBC: 0 % (ref 0.0–0.2)

## 2023-06-12 LAB — URINALYSIS, ROUTINE W REFLEX MICROSCOPIC
Bilirubin Urine: NEGATIVE
Glucose, UA: NEGATIVE mg/dL
Ketones, ur: 15 mg/dL — AB
Leukocytes,Ua: NEGATIVE
Nitrite: NEGATIVE
Protein, ur: NEGATIVE mg/dL
Specific Gravity, Urine: 1.01 (ref 1.005–1.030)
pH: 7 (ref 5.0–8.0)

## 2023-06-12 LAB — URINALYSIS, MICROSCOPIC (REFLEX)
Bacteria, UA: NONE SEEN
RBC / HPF: NONE SEEN RBC/hpf (ref 0–5)

## 2023-06-12 LAB — COMPREHENSIVE METABOLIC PANEL
ALT: 37 U/L (ref 0–44)
AST: 26 U/L (ref 15–41)
Albumin: 4.2 g/dL (ref 3.5–5.0)
Alkaline Phosphatase: 58 U/L (ref 38–126)
Anion gap: 11 (ref 5–15)
BUN: 13 mg/dL (ref 6–20)
CO2: 21 mmol/L — ABNORMAL LOW (ref 22–32)
Calcium: 9.1 mg/dL (ref 8.9–10.3)
Chloride: 104 mmol/L (ref 98–111)
Creatinine, Ser: 1.08 mg/dL (ref 0.61–1.24)
GFR, Estimated: >60 mL/min (ref >60)
Glucose, Bld: 106 mg/dL — ABNORMAL HIGH (ref 70–99)
Potassium: 3.6 mmol/L (ref 3.5–5.1)
Sodium: 136 mmol/L (ref 135–145)
Total Bilirubin: 1 mg/dL (ref 0.3–1.2)
Total Protein: 7.7 g/dL (ref 6.5–8.1)

## 2023-06-12 LAB — LIPASE, BLOOD: Lipase: 121 U/L — ABNORMAL HIGH (ref 11–51)

## 2023-06-12 MED ORDER — METOCLOPRAMIDE HCL 5 MG/ML IJ SOLN
10.0000 mg | Freq: Once | INTRAMUSCULAR | Status: AC
  Administered 2023-06-12: 10 mg via INTRAVENOUS
  Filled 2023-06-12: qty 2

## 2023-06-12 MED ORDER — ONDANSETRON 4 MG PO TBDP: 4.0000 mg | ORAL_TABLET | Freq: Three times a day (TID) | ORAL | 0 refills | Status: DC | PRN

## 2023-06-12 MED ORDER — SODIUM CHLORIDE 0.9 % IV BOLUS
1000.0000 mL | Freq: Once | INTRAVENOUS | Status: AC
  Administered 2023-06-12: 1000 mL via INTRAVENOUS

## 2023-06-12 MED ORDER — ONDANSETRON HCL 4 MG/2ML IJ SOLN
4.0000 mg | Freq: Once | INTRAMUSCULAR | Status: AC
  Administered 2023-06-12: 4 mg via INTRAVENOUS
  Filled 2023-06-12: qty 2

## 2023-06-12 MED ORDER — MORPHINE SULFATE (PF) 4 MG/ML IV SOLN
4.0000 mg | Freq: Once | INTRAVENOUS | Status: AC
  Administered 2023-06-12: 4 mg via INTRAVENOUS
  Filled 2023-06-12: qty 1

## 2023-06-12 MED ORDER — IOHEXOL 300 MG/ML  SOLN
100.0000 mL | Freq: Once | INTRAMUSCULAR | Status: AC | PRN
  Administered 2023-06-12: 100 mL via INTRAVENOUS

## 2023-06-12 MED ORDER — OXYCODONE-ACETAMINOPHEN 5-325 MG PO TABS
1.0000 | ORAL_TABLET | Freq: Three times a day (TID) | ORAL | 0 refills | Status: AC | PRN
Start: 2023-06-12 — End: 2023-06-15

## 2023-06-12 NOTE — ED Provider Notes (Signed)
[facility] Emergency Department Provider Note     Event Date/Time   First MD Initiated Contact with Patient 06/12/23 1858     (approximate)   History   Abdominal Pain   HPI  Brent [name] is a 37 y.o. male with a history of UC, GERD, and pancreatitis, presents to the ED for evaluation of LUQ abd pain x 3 days. He endorses NVD, but denies FCS. He also notes loose, bloody stools. He as admitted 8 months prior for acute pancreatitis and UC exacerbation. He was previously not on medications for his UC maintenance.  He admits to being poorly compliant with instructions to follow-up with GI medicine following his recent admission in January.  He is yet to select a primary provider as well.  Patient began a liquid diet at the onset of his symptoms.  He notes today he was able to eat 3 pieces of toast without difficulty.   Physical Exam   Triage Vital Signs: ED Triage Vitals  Encounter Vitals Group     BP 06/12/23 1851 (!) 142/94     Systolic BP Percentile --      Diastolic BP Percentile --      Pulse Rate 06/12/23 1851 100     Resp 06/12/23 1851 18     Temp 06/12/23 1851 98.4 F (36.9 C)     Temp Source 06/12/23 1851 Oral     SpO2 06/12/23 1851 98 %     Weight 06/12/23 1851 220 lb (99.8 kg)     Height 06/12/23 1851 5\' 11"  (1.803 m)     Head Circumference --      Peak Flow --      Pain Score 06/12/23 1850 10     Pain Loc --      Pain Education --      Exclude from Growth Chart --     Most recent vital signs: Vitals:   06/12/23 1851 06/12/23 2319  BP: (!) 142/94 (!) 136/91  Pulse: 100   Resp: 18 16  Temp: 98.4 F (36.9 C) 97.9 F (36.6 C)  SpO2: 98%     General Awake, no distress. NAD HEENT NCAT. PERRL. EOMI. No rhinorrhea. Mucous membranes are moist.  CV:  Good peripheral perfusion. RRR RESP:  Normal effort. CTA ABD:  No distention.  Soft and mildly tender to palpation to the left upper quadrant.  No rebound, guarding, rigidity noted.   Normal bowel sounds appreciated.  No organomegaly noted.   ED Results / Procedures / Treatments   Labs (all labs ordered are listed, but only abnormal results are displayed) Labs Reviewed  COMPREHENSIVE METABOLIC PANEL - Abnormal; Notable for the following components:      Result Value   CO2 21 (*)    Glucose, Bld 106 (*)    All other components within normal limits  LIPASE, BLOOD - Abnormal; Notable for the following components:   Lipase 121 (*)    All other components within normal limits  URINALYSIS, ROUTINE W REFLEX MICROSCOPIC - Abnormal; Notable for the following components:   Hgb urine dipstick TRACE (*)    Ketones, ur 15 (*)    All other components within normal limits  CBC WITH DIFFERENTIAL/PLATELET  URINALYSIS, MICROSCOPIC (REFLEX)     EKG   RADIOLOGY  I personally viewed and evaluated these images as part of my medical decision making, as well as reviewing the written report by the radiologist.  ED Provider Interpretation: no acute findings  CT ABDOMEN PELVIS W CONTRAST  Result Date: 06/12/2023 CLINICAL DATA:  Left lower quadrant abdominal pain. Patient has a history of ulcerative colitis. EXAM: CT ABDOMEN AND PELVIS WITH CONTRAST TECHNIQUE: Multidetector CT imaging of the abdomen and pelvis was performed using the standard protocol following bolus administration of intravenous contrast. RADIATION DOSE REDUCTION: This exam was performed according to the departmental dose-optimization program which includes automated exposure control, adjustment of the mA and/or kV according to patient size and/or use of iterative reconstruction technique. CONTRAST:  OMNIPAQUE IOHEXOL 300 MG/ML  SOLN COMPARISON:  CT abdomen and pelvis dated 10/21/2022. FINDINGS: Lower chest: No acute abnormality. Hepatobiliary: No focal liver abnormality is seen. The liver is diffusely hypoattenuating, consistent with hepatic steatosis. No gallstones, gallbladder wall thickening, or biliary  dilatation. Pancreas: Unremarkable. No pancreatic ductal dilatation or surrounding inflammatory changes. Spleen: Normal in size without focal abnormality. Adrenals/Urinary Tract: Adrenal glands are unremarkable. Kidneys are normal, without renal calculi, focal lesion, or hydronephrosis. Bladder is unremarkable. Stomach/Bowel: Stomach is within normal limits. Appendix appears normal. There is mild bowel wall thickening of the colon extending from the rectum to the transverse colon. This is similar in appearance to prior exam and there is no significant surrounding inflammatory changes. No evidence of bowel obstruction. Vascular/Lymphatic: No significant vascular findings are present. No enlarged abdominal or pelvic lymph nodes. Reproductive: Prostate is unremarkable. Other: No abdominal wall hernia or abnormality. No abdominopelvic ascites. Musculoskeletal: No acute or significant osseous findings. IMPRESSION: 1. Mild bowel wall thickening of the colon extending from the rectum to the transverse colon is similar in appearance to prior exam and may reflect chronic inflammation. 2. Hepatic steatosis. Electronically Signed   By: Romona Curls M.D.   On: 06/12/2023 20:55     PROCEDURES:  Critical Care performed: No  Procedures   MEDICATIONS ORDERED IN ED: Medications  morphine (PF) 4 MG/ML injection 4 mg (4 mg Intravenous Given 06/12/23 1949)  ondansetron (ZOFRAN) injection 4 mg (4 mg Intravenous Given 06/12/23 1948)  iohexol (OMNIPAQUE) 300 MG/ML solution 100 mL (100 mLs Intravenous Contrast Given 06/12/23 2016)  sodium chloride 0.9 % bolus 1,000 mL (0 mLs Intravenous Stopped 06/12/23 2317)  metoCLOPramide (REGLAN) injection 10 mg (10 mg Intravenous Given 06/12/23 2329)     IMPRESSION / MDM / ASSESSMENT AND PLAN / ED COURSE  I reviewed the triage vital signs and the nursing notes.                              Differential diagnosis includes, but is not limited to, acute appendicitis, renal colic,  testicular torsion, urinary tract infection/pyelonephritis, prostatitis,  epididymitis, diverticulitis, small bowel obstruction or ileus, colitis, abdominal aortic aneurysm, gastroenteritis, hernia, etc.   Patient's presentation is most consistent with acute complicated illness / injury requiring diagnostic workup.  Patient's diagnosis is consistent with left upper quadrant abdominal pain as well as some associated nausea, vomiting, diarrhea.  Patient with mildly elevated lipase at 120 without CT evidence of an acute pancreatitis or necrosis.  Normal appendix with evidence of stable colonic inflammation from the rectum to the transverse colon noted on CT, as reviewed and interpreted by me.  Patient with stable exam and course in the ED without intractable nausea or vomiting.  He was treated with IV antiemetics as well as a fluid bolus.  No indication for at [place] at this time given the patient's stable presentation, vital signs, labs without evidence of acute renal failure  or sepsis on presentation.  Patient will be discharged home with prescriptions for Percocet and Zofran. Patient is to follow up with GI medicine as discussed, as needed or otherwise directed. Patient is given strict ED precautions to return to the ED for any worsening or new symptoms.   FINAL CLINICAL IMPRESSION(S) / ED DIAGNOSES   Final diagnoses:  Left upper quadrant abdominal pain  Nausea vomiting and diarrhea     Rx / DC Orders   ED Discharge Orders          Ordered    ondansetron (ZOFRAN-ODT) 4 MG disintegrating tablet  Every 8 hours PRN        06/12/23 2309    oxyCODONE-acetaminophen (PERCOCET) 5-325 MG tablet  Every 8 hours PRN        06/12/23 2309             Note:  This document was prepared using Dragon voice recognition software and may include unintentional dictation errors.    Lissa Hoard, PA-C 06/12/23 2338    Dionne Bucy, MD 06/13/23 2156

## 2023-06-12 NOTE — ED Triage Notes (Signed)
Pt endorses left sided abd pain since Friday. Hx of ulcerative colitis. Endorses n/v/d.

## 2023-07-07 ENCOUNTER — Encounter: Payer: Self-pay | Admitting: Emergency Medicine

## 2023-07-07 ENCOUNTER — Ambulatory Visit
Admission: EM | Admit: 2023-07-07 | Discharge: 2023-07-07 | Disposition: A | Discharge status: Home / Self Care | Payer: BC Managed Care – PPO | Attending: Emergency Medicine | Admitting: Emergency Medicine

## 2023-07-07 DIAGNOSIS — J0140 Acute pansinusitis, unspecified: Secondary | ICD-10-CM

## 2023-07-07 MED ORDER — IPRATROPIUM BROMIDE 0.03 % NA SOLN: 2.0000 | Freq: Two times a day (BID) | NASAL | 12 refills | Status: DC

## 2023-07-07 MED ORDER — ONDANSETRON 8 MG PO TBDP
8.0000 mg | ORAL_TABLET | Freq: Once | ORAL | Status: AC
  Administered 2023-07-07: 8 mg via ORAL

## 2023-07-07 MED ORDER — PREDNISONE 20 MG PO TABS: 40.0000 mg | ORAL_TABLET | Freq: Every day | ORAL | 0 refills | Status: DC

## 2023-07-07 MED ORDER — ONDANSETRON 4 MG PO TBDP: 4.0000 mg | ORAL_TABLET | Freq: Three times a day (TID) | ORAL | 0 refills | Status: DC | PRN

## 2023-07-07 MED ORDER — ONDANSETRON HCL 4 MG/2ML IJ SOLN
4.0000 mg | Freq: Once | INTRAMUSCULAR | Status: AC
  Administered 2023-07-07: 4 mg via INTRAMUSCULAR

## 2023-07-07 MED ORDER — DEXAMETHASONE SODIUM PHOSPHATE 10 MG/ML IJ SOLN
10.0000 mg | Freq: Once | INTRAMUSCULAR | Status: AC
  Administered 2023-07-07: 10 mg via INTRAMUSCULAR

## 2023-07-07 MED ORDER — AMOXICILLIN-POT CLAVULANATE 875-125 MG PO TABS
1.0000 | ORAL_TABLET | Freq: Two times a day (BID) | ORAL | 0 refills | Status: AC
Start: 2023-07-07 — End: 2023-07-17

## 2023-07-07 NOTE — ED Triage Notes (Signed)
Pt presents with a cough, headache, SOB, sinus pressure, and nausea x 1 month.

## 2023-07-07 NOTE — ED Provider Notes (Addendum)
Renaldo Fiddler    CSN: [id number] Arrival date & time: 07/07/23  1442      History   Chief Complaint Chief Complaint  Patient presents with   sinus pressure    Shortness of Breath   Cough   Nausea   Headache    HPI Brent [name] is a 37 y.o. male.   Patient presents for evaluation of nasal congestion, rhinorrhea, sinus pain and pressure, headaches, sore throat, coughing and shortness of breath present for 30 days.  Sinus pain and pressure primarily to the left side affecting the forehead, the cheeks and the teeth.  Dental pain making it difficult to chew therefore has had poor food intake.  Associated left-sided frontal headaches that have been constant, occurs daily.  Cough is productive with lime green sputum, shortness of breath occurring with cough and with exertion, intermittent wheezing at times.  Associated nausea with vomiting, last occurrence in office.  Known sick contact with exposure to RSV at onset of symptoms, exposure in household.  Has attempted use of Mucinex, over-the-counter cold and flu medication and Tylenol which have been ineffective.  Over the past month had ulcerative colitis flare which she believes exacerbated respiratory symptoms.  Denies respiratory history, vapes daily.   Past Medical History:  Diagnosis Date   Ulcerative colitis [facility])     Patient Active Problem List   Diagnosis Date Noted   Pancreatitis 10/21/2022   Attention deficit hyperactivity disorder 01/14/2021   Ulcerative colitis (HCC) 12/30/2020   Pancreatic lesion 12/30/2020   Adult ADHD 12/30/2020   Tobacco abuse 12/30/2020   Depression 12/30/2020   BMI 31.0-31.9,adult 02/07/2019   Encounter for long-term (current) use of other medications 02/07/2019   Gastroesophageal reflux disease without esophagitis 02/07/2019   Nicotine dependence, other tobacco product, uncomplicated 02/07/2019    Past Surgical History:  Procedure Laterality Date   COLONOSCOPY WITH PROPOFOL N/A  01/01/2021   Procedure: COLONOSCOPY WITH PROPOFOL;  Surgeon: Toney Reil, MD;  Location: [facility] ENDOSCOPY;  Service: Gastroenterology;  Laterality: N/A;   TYMPANOSTOMY TUBE PLACEMENT         Home Medications    Prior to Admission medications   Medication Sig Start Date End Date Taking? Authorizing Provider  amoxicillin-clavulanate (AUGMENTIN) 875-125 MG tablet Take 1 tablet by mouth every 12 (twelve) hours for 10 days. 07/07/23 07/17/23 Yes Annaliese Saez R, NP  ipratropium (ATROVENT) 0.03 % nasal spray Place 2 sprays into both nostrils every 12 (twelve) hours. 07/07/23  Yes Charletha Dalpe R, NP  ondansetron (ZOFRAN-ODT) 4 MG disintegrating tablet Take 1 tablet (4 mg total) by mouth every 8 (eight) hours as needed for nausea or vomiting. 07/07/23  Yes Cristin Szatkowski R, NP  predniSONE (DELTASONE) 20 MG tablet Take 2 tablets (40 mg total) by mouth daily. 07/07/23  Yes Ibeth Fahmy R, NP  omeprazole (PRILOSEC OTC) 20 MG tablet Take 40 mg by mouth daily.    [provider]    Family History No family history on file.  Social History Social History   Tobacco Use   Smoking status: Never   Smokeless tobacco: Never  Vaping Use   Vaping status: Every Day  Substance Use Topics   Alcohol use: Not Currently   Drug use: Not Currently     Allergies   Wasp venom protein   Review of Systems Review of Systems   Physical Exam Triage Vital Signs ED Triage Vitals  Encounter Vitals Group     BP 07/07/23 1459 132/79  Systolic BP Percentile --      Diastolic BP Percentile --      Pulse Rate 07/07/23 1459 (!) 123     Resp 07/07/23 1459 18     Temp 07/07/23 1459 99.5 F (37.5 C)     Temp Source 07/07/23 1459 Oral     SpO2 07/07/23 1459 97 %     Weight --      Height --      Head Circumference --      Peak Flow --      Pain Score 07/07/23 1458 8     Pain Loc --      Pain Education --      Exclude from Growth Chart --    No data found.  Updated Vital  Signs BP 132/79 (BP Location: Right Arm)   Pulse (!) 123   Temp 99.5 F (37.5 C) (Oral)   Resp 18   SpO2 97%   Visual Acuity Right Eye Distance:   Left Eye Distance:   Bilateral Distance:    Right Eye Near:   Left Eye Near:    Bilateral Near:     Physical Exam Constitutional:      Appearance: He is ill-appearing.  HENT:     Head: Normocephalic.     Right Ear: Tympanic membrane, ear canal and external ear normal.     Left Ear: Tympanic membrane, ear canal and external ear normal.     Nose: Congestion and rhinorrhea present.     Right Sinus: No maxillary sinus tenderness or frontal sinus tenderness.     Left Sinus: Maxillary sinus tenderness and frontal sinus tenderness present.     Mouth/Throat:     Pharynx: No oropharyngeal exudate or posterior oropharyngeal erythema.  Eyes:     Extraocular Movements: Extraocular movements intact.  Cardiovascular:     Rate and Rhythm: Normal rate and regular rhythm.     Pulses: Normal pulses.     Heart sounds: Normal heart sounds.  Pulmonary:     Effort: Pulmonary effort is normal.     Breath sounds: Normal breath sounds.  Musculoskeletal:        General: Normal range of motion.     Cervical back: Normal range of motion and neck supple.  Skin:    General: Skin is warm and dry.  Neurological:     Mental Status: He is alert and oriented to person, place, and time. Mental status is at baseline.      UC Treatments / Results  Labs (all labs ordered are listed, but only abnormal results are displayed) Labs Reviewed - No data to display  EKG   Radiology No results found.  Procedures Procedures (including critical care time)  Medications Ordered in UC Medications  ondansetron (ZOFRAN) injection 4 mg (4 mg Intramuscular Given 07/07/23 1521)  dexamethasone (DECADRON) injection 10 mg (10 mg Intramuscular Given 07/07/23 1521)    Initial Impression / Assessment and Plan / UC Course  I have reviewed the triage vital signs and the  nursing notes.  Pertinent labs & imaging results that were available during my care of the patient were reviewed by me and considered in my medical decision making (see chart for details).  Acute Nonrecurrent pansinusitis  Vital signs are stable, while ill-appearing patient is in no signs of distress nontoxic-appearing, actively vomiting within exam room, 4 mg of IM Zofran given and has been given water to sip on, given 10 mg of Decadron for management of headache, on  reevaluation after 20 minutes, symptoms have begun to improve, given 8 mg of Zofran ODT, reevaluation after 10 minutes, symptoms have resolved ,presentation of symptoms consistent with a sinusitis and as they have been present for 30 days without resolution we will be providing antibiotic for outpatient use, exam lungs are clear to auscultation and O2 saturation 97% on room air therefore imaging deferred at this time, reevaluation patient has been able to tolerate water therefore stable for discharge, prescribed Augmentin, Zofran, prednisone and Atrovent nasal spray, may additionally use over-the-counter medications as needed, may follow-up with this urgent care if symptoms continue to persist or Final Clinical Impressions(s) / UC Diagnoses   Final diagnoses:  Acute non-recurrent pansinusitis     Discharge Instructions      Symptoms have been present for 1 month without signs of resolving therefore you have been started on antibiotic to provide coverage for bacteria that is most likely causing symptoms to prolong  Begin Augmentin every morning and every evening for 10 days, will take 48 hours for medicine to reach peak within body but should see improvement day by day  Been given a steroid injection here today in the office to help reduce pressure and minimize headache, daily will start to see some improvement in 30 minutes to an hour      You can take Tylenol and/or Ibuprofen as needed for fever reduction and pain relief.    For cough: honey 1/2 to 1 teaspoon (you can dilute the honey in water or another fluid).  You can also use guaifenesin and dextromethorphan for cough. You can use a humidifier for chest congestion and cough.  If you don't have a humidifier, you can sit in the bathroom with the hot shower running.      For sore throat: try warm salt water gargles, cepacol lozenges, throat spray, warm tea or water with lemon/honey, popsicles or ice, or OTC cold relief medicine for throat discomfort.   For congestion: take a daily anti-histamine like Zyrtec, Claritin, and a oral decongestant, such as pseudoephedrine.  You can also use Flonase 1-2 sprays in each nostril daily.   It is important to stay hydrated: drink plenty of fluids (water, gatorade/powerade/pedialyte, juices, or teas) to keep your throat moisturized and help further relieve irritation/discomfort.    ED Prescriptions     Medication Sig Dispense Auth. Provider   amoxicillin-clavulanate (AUGMENTIN) 875-125 MG tablet Take 1 tablet by mouth every 12 (twelve) hours for 10 days. 20 tablet Kayceon Oki R, NP   ondansetron (ZOFRAN-ODT) 4 MG disintegrating tablet Take 1 tablet (4 mg total) by mouth every 8 (eight) hours as needed for nausea or vomiting. 20 tablet Patrena Santalucia R, NP   predniSONE (DELTASONE) 20 MG tablet Take 2 tablets (40 mg total) by mouth daily. 10 tablet Enoc Getter R, NP   ipratropium (ATROVENT) 0.03 % nasal spray Place 2 sprays into both nostrils every 12 (twelve) hours. 30 mL Valinda Hoar, NP      PDMP not reviewed this encounter.   Valinda Hoar, NP 07/07/23 1527    Valinda Hoar, [place] 07/07/23 [phone]

## 2023-07-07 NOTE — Discharge Instructions (Signed)
Symptoms have been present for 1 month without signs of resolving therefore you have been started on antibiotic to provide coverage for bacteria that is most likely causing symptoms to prolong  Begin Augmentin every morning and every evening for 10 days, will take 48 hours for medicine to reach peak within body but should see improvement day by day  Been given a steroid injection here today in the office to help reduce pressure and minimize headache, daily will start to see some improvement in 30 minutes to an hour      You can take Tylenol and/or Ibuprofen as needed for fever reduction and pain relief.   For cough: honey 1/2 to 1 teaspoon (you can dilute the honey in water or another fluid).  You can also use guaifenesin and dextromethorphan for cough. You can use a humidifier for chest congestion and cough.  If you don't have a humidifier, you can sit in the bathroom with the hot shower running.      For sore throat: try warm salt water gargles, cepacol lozenges, throat spray, warm tea or water with lemon/honey, popsicles or ice, or OTC cold relief medicine for throat discomfort.   For congestion: take a daily anti-histamine like Zyrtec, Claritin, and a oral decongestant, such as pseudoephedrine.  You can also use Flonase 1-2 sprays in each nostril daily.   It is important to stay hydrated: drink plenty of fluids (water, gatorade/powerade/pedialyte, juices, or teas) to keep your throat moisturized and help further relieve irritation/discomfort.

## 2024-01-11 ENCOUNTER — Emergency Department: Payer: PRIVATE HEALTH INSURANCE

## 2024-01-11 ENCOUNTER — Observation Stay: Payer: PRIVATE HEALTH INSURANCE

## 2024-01-11 ENCOUNTER — Other Ambulatory Visit: Payer: Self-pay

## 2024-01-11 ENCOUNTER — Encounter: Payer: Self-pay | Admitting: Emergency Medicine

## 2024-01-11 ENCOUNTER — Inpatient Hospital Stay
Admission: EM | Admit: 2024-01-11 | Discharge: 2024-01-13 | DRG: 386 | Disposition: A | Discharge status: Home / Self Care | Payer: PRIVATE HEALTH INSURANCE | Attending: Internal Medicine | Admitting: Internal Medicine

## 2024-01-11 DIAGNOSIS — Z6831 Body mass index (BMI) 31.0-31.9, adult: Secondary | ICD-10-CM

## 2024-01-11 DIAGNOSIS — K760 Fatty (change of) liver, not elsewhere classified: Secondary | ICD-10-CM | POA: Insufficient documentation

## 2024-01-11 DIAGNOSIS — K219 Gastro-esophageal reflux disease without esophagitis: Secondary | ICD-10-CM | POA: Diagnosis present

## 2024-01-11 DIAGNOSIS — R112 Nausea with vomiting, unspecified: Secondary | ICD-10-CM

## 2024-01-11 DIAGNOSIS — R1084 Generalized abdominal pain: Secondary | ICD-10-CM | POA: Diagnosis not present

## 2024-01-11 DIAGNOSIS — E871 Hypo-osmolality and hyponatremia: Secondary | ICD-10-CM | POA: Diagnosis present

## 2024-01-11 DIAGNOSIS — R748 Abnormal levels of other serum enzymes: Secondary | ICD-10-CM

## 2024-01-11 DIAGNOSIS — F17290 Nicotine dependence, other tobacco product, uncomplicated: Secondary | ICD-10-CM | POA: Diagnosis present

## 2024-01-11 DIAGNOSIS — E88819 Insulin resistance, unspecified: Secondary | ICD-10-CM | POA: Diagnosis present

## 2024-01-11 DIAGNOSIS — Z91141 Patient's other noncompliance with medication regimen due to financial hardship: Secondary | ICD-10-CM

## 2024-01-11 DIAGNOSIS — F17210 Nicotine dependence, cigarettes, uncomplicated: Secondary | ICD-10-CM | POA: Diagnosis present

## 2024-01-11 DIAGNOSIS — Z794 Long term (current) use of insulin: Secondary | ICD-10-CM

## 2024-01-11 DIAGNOSIS — R739 Hyperglycemia, unspecified: Secondary | ICD-10-CM

## 2024-01-11 DIAGNOSIS — R17 Unspecified jaundice: Secondary | ICD-10-CM | POA: Insufficient documentation

## 2024-01-11 DIAGNOSIS — R7989 Other specified abnormal findings of blood chemistry: Secondary | ICD-10-CM | POA: Insufficient documentation

## 2024-01-11 DIAGNOSIS — E66811 Obesity, class 1: Secondary | ICD-10-CM | POA: Diagnosis present

## 2024-01-11 DIAGNOSIS — I10 Essential (primary) hypertension: Secondary | ICD-10-CM | POA: Diagnosis present

## 2024-01-11 DIAGNOSIS — F32A Depression, unspecified: Secondary | ICD-10-CM | POA: Diagnosis present

## 2024-01-11 DIAGNOSIS — K5190 Ulcerative colitis, unspecified, without complications: Principal | ICD-10-CM | POA: Diagnosis present

## 2024-01-11 DIAGNOSIS — E1165 Type 2 diabetes mellitus with hyperglycemia: Secondary | ICD-10-CM | POA: Diagnosis present

## 2024-01-11 DIAGNOSIS — Z558 Other problems related to education and literacy: Secondary | ICD-10-CM | POA: Insufficient documentation

## 2024-01-11 DIAGNOSIS — E119 Type 2 diabetes mellitus without complications: Principal | ICD-10-CM

## 2024-01-11 DIAGNOSIS — E872 Acidosis, unspecified: Secondary | ICD-10-CM | POA: Diagnosis present

## 2024-01-11 DIAGNOSIS — K8020 Calculus of gallbladder without cholecystitis without obstruction: Secondary | ICD-10-CM | POA: Diagnosis present

## 2024-01-11 DIAGNOSIS — R109 Unspecified abdominal pain: Secondary | ICD-10-CM | POA: Diagnosis present

## 2024-01-11 DIAGNOSIS — F909 Attention-deficit hyperactivity disorder, unspecified type: Secondary | ICD-10-CM | POA: Diagnosis present

## 2024-01-11 DIAGNOSIS — K7581 Nonalcoholic steatohepatitis (NASH): Secondary | ICD-10-CM | POA: Diagnosis present

## 2024-01-11 LAB — COMPREHENSIVE METABOLIC PANEL WITH GFR
ALT: 66 U/L — ABNORMAL HIGH (ref 0–44)
AST: 39 U/L (ref 15–41)
Albumin: 4.6 g/dL (ref 3.5–5.0)
Alkaline Phosphatase: 84 U/L (ref 38–126)
Anion gap: 12 (ref 5–15)
BUN: 16 mg/dL (ref 6–20)
CO2: 20 mmol/L — ABNORMAL LOW (ref 22–32)
Calcium: 9 mg/dL (ref 8.9–10.3)
Chloride: 102 mmol/L (ref 98–111)
Creatinine, Ser: 0.95 mg/dL (ref 0.61–1.24)
GFR, Estimated: >60 mL/min (ref >60)
Glucose, Bld: 351 mg/dL — ABNORMAL HIGH (ref 70–99)
Potassium: 4.6 mmol/L (ref 3.5–5.1)
Sodium: 134 mmol/L — ABNORMAL LOW (ref 135–145)
Total Bilirubin: 1.5 mg/dL — ABNORMAL HIGH (ref 0.0–1.2)
Total Protein: 8 g/dL (ref 6.5–8.1)

## 2024-01-11 LAB — LIPID PANEL
Cholesterol: 87 mg/dL (ref 0–200)
HDL: 24 mg/dL — ABNORMAL LOW (ref >40)
LDL Cholesterol: 5 mg/dL (ref 0–99)
Total CHOL/HDL Ratio: 3.6 ratio
Triglycerides: 290 mg/dL — ABNORMAL HIGH (ref <150)
VLDL: 58 mg/dL — ABNORMAL HIGH (ref 0–40)

## 2024-01-11 LAB — CBC
HCT: 46.9 % (ref 39.0–52.0)
Hemoglobin: 18.6 g/dL — ABNORMAL HIGH (ref 13.0–17.0)
Hemoglobin: 17.5 g/dL — ABNORMAL HIGH (ref 13.0–17.0)
MCH: 32 pg (ref 26.0–34.0)
MCHC: 37.3 g/dL — ABNORMAL HIGH (ref 30.0–36.0)
MCV: 85.7 fL (ref 80.0–100.0)
Platelets: 247 10*3/uL (ref 150–400)
Platelets: 196 10*3/uL (ref 150–400)
RBC: 5.47 MIL/uL (ref 4.22–5.81)
RDW: 12 % (ref 11.5–15.5)
WBC: 11.7 10*3/uL — ABNORMAL HIGH (ref 4.0–10.5)
WBC: 10.6 10*3/uL — ABNORMAL HIGH (ref 4.0–10.5)
nRBC: 0 % (ref 0.0–0.2)

## 2024-01-11 LAB — BLOOD GAS, VENOUS
Acid-base deficit: 5.1 mmol/L — ABNORMAL HIGH (ref 0.0–2.0)
Bicarbonate: 19.9 mmol/L — ABNORMAL LOW (ref 20.0–28.0)
O2 Saturation: 96.9 %
Patient temperature: 37
pCO2, Ven: 36 mmHg — ABNORMAL LOW (ref 44–60)
pH, Ven: 7.35 (ref 7.25–7.43)
pO2, Ven: 78 mmHg — ABNORMAL HIGH (ref 32–45)

## 2024-01-11 LAB — CBG MONITORING, ED
Glucose-Capillary: 317 mg/dL — ABNORMAL HIGH (ref 70–99)
Glucose-Capillary: 198 mg/dL — ABNORMAL HIGH (ref 70–99)

## 2024-01-11 LAB — LIPASE, BLOOD: Lipase: 31 U/L (ref 11–51)

## 2024-01-11 LAB — GLUCOSE, CAPILLARY: Glucose-Capillary: 278 mg/dL — ABNORMAL HIGH (ref 70–99)

## 2024-01-11 LAB — CK: Total CK: 109 U/L (ref 49–397)

## 2024-01-11 LAB — BETA-HYDROXYBUTYRIC ACID: Beta-Hydroxybutyric Acid: 3.57 mmol/L — ABNORMAL HIGH (ref 0.05–0.27)

## 2024-01-11 MED ORDER — MORPHINE SULFATE (PF) 2 MG/ML IV SOLN: 2.0000 mg | INTRAVENOUS | Status: DC | PRN

## 2024-01-11 MED ORDER — ORAL CARE MOUTH RINSE: 15.0000 mL | OROMUCOSAL | Status: DC | PRN

## 2024-01-11 MED ORDER — INSULIN ASPART 100 UNIT/ML IJ SOLN
0.0000 [IU] | Freq: Every day | INTRAMUSCULAR | Status: DC
  Administered 2024-01-11 – 2024-01-12 (×2): 3 [IU] via SUBCUTANEOUS
  Filled 2024-01-11 (×2): qty 1

## 2024-01-11 MED ORDER — SODIUM CHLORIDE 0.9 % IV SOLN: INTRAVENOUS | Status: DC

## 2024-01-11 MED ORDER — HEPARIN SODIUM (PORCINE) 5000 UNIT/ML IJ SOLN
5000.0000 [IU] | Freq: Three times a day (TID) | INTRAMUSCULAR | Status: DC
  Administered 2024-01-11 – 2024-01-13 (×5): 5000 [IU] via SUBCUTANEOUS
  Filled 2024-01-11 (×5): qty 1

## 2024-01-11 MED ORDER — ONDANSETRON HCL 4 MG/2ML IJ SOLN
4.0000 mg | INTRAMUSCULAR | Status: AC
  Administered 2024-01-11: 4 mg via INTRAVENOUS
  Filled 2024-01-11: qty 2

## 2024-01-11 MED ORDER — ACETAMINOPHEN 325 MG PO TABS: 650.0000 mg | ORAL_TABLET | Freq: Four times a day (QID) | ORAL | Status: DC | PRN

## 2024-01-11 MED ORDER — IOHEXOL 300 MG/ML  SOLN
100.0000 mL | Freq: Once | INTRAMUSCULAR | Status: AC | PRN
  Administered 2024-01-11: 100 mL via INTRAVENOUS

## 2024-01-11 MED ORDER — SODIUM CHLORIDE 0.9 % IV BOLUS
1000.0000 mL | Freq: Once | INTRAVENOUS | Status: AC
  Administered 2024-01-11: 1000 mL via INTRAVENOUS

## 2024-01-11 MED ORDER — ONDANSETRON HCL 4 MG/2ML IJ SOLN
4.0000 mg | Freq: Four times a day (QID) | INTRAMUSCULAR | Status: DC | PRN
  Administered 2024-01-12 (×2): 4 mg via INTRAVENOUS
  Filled 2024-01-11 (×2): qty 2

## 2024-01-11 MED ORDER — MORPHINE SULFATE (PF) 4 MG/ML IV SOLN
4.0000 mg | Freq: Once | INTRAVENOUS | Status: AC
  Administered 2024-01-11: 4 mg via INTRAVENOUS
  Filled 2024-01-11: qty 1

## 2024-01-11 MED ORDER — INSULIN ASPART 100 UNIT/ML IJ SOLN
0.0000 [IU] | Freq: Three times a day (TID) | INTRAMUSCULAR | Status: DC
  Administered 2024-01-11: 1 [IU] via SUBCUTANEOUS
  Administered 2024-01-12: 2 [IU] via SUBCUTANEOUS
  Administered 2024-01-12: 3 [IU] via SUBCUTANEOUS
  Administered 2024-01-12: 2 [IU] via SUBCUTANEOUS
  Administered 2024-01-13: 1 [IU] via SUBCUTANEOUS
  Filled 2024-01-11 (×5): qty 1

## 2024-01-11 MED ORDER — MORPHINE SULFATE (PF) 4 MG/ML IV SOLN
4.0000 mg | INTRAVENOUS | Status: DC | PRN
  Administered 2024-01-12: 4 mg via INTRAVENOUS
  Filled 2024-01-11: qty 1

## 2024-01-11 MED ORDER — ACETAMINOPHEN 650 MG RE SUPP: 650.0000 mg | Freq: Four times a day (QID) | RECTAL | Status: DC | PRN

## 2024-01-11 MED ORDER — NICOTINE 21 MG/24HR TD PT24
21.0000 mg | MEDICATED_PATCH | Freq: Every day | TRANSDERMAL | Status: DC | PRN
  Administered 2024-01-12: 21 mg via TRANSDERMAL
  Filled 2024-01-11: qty 1

## 2024-01-11 MED ORDER — ONDANSETRON HCL 4 MG PO TABS: 4.0000 mg | ORAL_TABLET | Freq: Four times a day (QID) | ORAL | Status: DC | PRN

## 2024-01-11 MED ORDER — HYDRALAZINE HCL 20 MG/ML IJ SOLN: 5.0000 mg | Freq: Four times a day (QID) | INTRAMUSCULAR | Status: DC | PRN

## 2024-01-11 NOTE — Assessment & Plan Note (Signed)
Suspected

## 2024-01-11 NOTE — Assessment & Plan Note (Signed)
 Etiology workup in progress Right upper quadrant ultrasound has been ordered Symptomatic support: Morphine 2 mg IV every 4 hours as needed for moderate pain, 1 day ordered; morphine 4 mg IV every 4 hours as needed for severe pain, 20 hours of coverage ordered

## 2024-01-11 NOTE — Progress Notes (Signed)
 Patient arrived from ED to 122A via stretcher and walked to the bed.    01/11/24 1740  Vitals  Temp 97.7 F (36.5 C)  Temp Source Oral  BP 139/86  MAP (mmHg) 99  BP Location Left Arm  BP Method Automatic  Patient Position (if appropriate) Lying  Pulse Rate 87  Pulse Rate Source Monitor  Resp 15  Level of Consciousness  Level of Consciousness Alert  Oxygen Therapy  SpO2 99 %  O2 Device Room Air  Pain Assessment  Pain Scale 0-10  Pain Score 0  MEWS Score  MEWS Temp 0  MEWS Systolic 0  MEWS Pulse 0  MEWS RR 0  MEWS LOC 0  MEWS Score 0  MEWS Score Color Green

## 2024-01-11 NOTE — ED Provider Notes (Signed)
 [facility] Provider Note    Event Date/Time   First MD Initiated Contact with Patient 01/11/24 [phone]     (approximate)   History   Emesis   HPI  Brent [name] is a 38 y.o. male with a history of ulcerative colitis and pancreatitis   Patient reports he was in normal health throughout the day yesterday.  During about the middle of the night he started having nausea and vomited multiple times.  He is also having an aching cramping feeling in his right abdomen he associates with having vomited so many times.  He does have a history of ulcerative colitis, used to follow with gastroenterology but is no longer on active treatments as he reports that the cost went up to about $3000 a month  He has had no fevers or chills.  He reports nausea, vomited up Zofran tablets to try to take at home.  He had symptoms like this intermittently for the last several years.  Normal he reports that his stomach will get upset he will vomit and asked her to feel better but this time the vomiting is continued  Currently reports a mild achiness almost like a crampy feeling in his right abdomen which she attributes to "vomiting".  He is still having normal bowel movements has not seen any bloody emesis he has not had any bloody bowel movements or diarrhea.  Last time he did see small amount of blood in his stool was probably in January attributes that to his ulcerative colitis  Physical Exam   Triage Vital Signs: ED Triage Vitals  Encounter Vitals Group     BP 01/11/24 0740 123/70     Systolic BP Percentile --      Diastolic BP Percentile --      Pulse Rate 01/11/24 0740 (!) 107     Resp 01/11/24 0740 20     Temp 01/11/24 0740 98 F (36.7 C)     Temp Source 01/11/24 0740 Oral     SpO2 01/11/24 0740 97 %     Weight 01/11/24 0741 220 lb (99.8 kg)     Height 01/11/24 0741 5\' 10"  (1.778 m)     Head Circumference --      Peak Flow --      Pain Score 01/11/24 0741 8     Pain Loc --       Pain Education --      Exclude from Growth Chart --     Most recent vital signs: Vitals:   01/12/24 0458 01/12/24 0733  BP: 126/77 136/79  Pulse: 67 73  Resp: 18 16  Temp: 97.6 F (36.4 C) 98.1 F (36.7 C)  SpO2: 96% 97%     General: Awake, no distress.  Emesis bag in hand does appear nauseated. CV:  Good peripheral perfusion.  Resp:  Normal effort.  Abd:  No distention.  Soft nontender nondistended throughout with exception to mild discomfort in the right upper quadrant without rebound or guarding.  No peritonitis. Other:  He is awake fully alert very pleasant.  Wife at the bedside.  Emesis bag in hand he does have approximately 100 mL of clear emesis in bag.  No bloody emesis   ED Results / Procedures / Treatments   Labs (all labs ordered are listed, but only abnormal results are displayed) Labs Reviewed  CBC - Abnormal; Notable for the following components:      Result Value   WBC 11.7 (*)  Hemoglobin 18.6 (*)    All other components within normal limits  COMPREHENSIVE METABOLIC PANEL WITH GFR - Abnormal; Notable for the following components:   Sodium 134 (*)    CO2 20 (*)    Glucose, Bld 351 (*)    ALT 66 (*)    Total Bilirubin 1.5 (*)    All other components within normal limits  BLOOD GAS, VENOUS - Abnormal; Notable for the following components:   pCO2, Ven 36 (*)    pO2, Ven 78 (*)    Bicarbonate 19.9 (*)    Acid-base deficit 5.1 (*)    All other components within normal limits  BETA-HYDROXYBUTYRIC ACID - Abnormal; Notable for the following components:   Beta-Hydroxybutyric Acid 3.57 (*)    All other components within normal limits  LIPID PANEL - Abnormal; Notable for the following components:   Triglycerides 290 (*)    HDL 24 (*)    VLDL 58 (*)    All other components within normal limits  CBC - Abnormal; Notable for the following components:   WBC 10.6 (*)    Hemoglobin 17.5 (*)    MCHC 37.3 (*)    All other components within normal limits   BASIC METABOLIC PANEL WITH GFR - Abnormal; Notable for the following components:   Sodium 134 (*)    CO2 19 (*)    Glucose, Bld 231 (*)    Calcium 8.1 (*)    All other components within normal limits  CBC - Abnormal; Notable for the following components:   MCHC 37.1 (*)    All other components within normal limits  HEPATIC FUNCTION PANEL - Abnormal; Notable for the following components:   AST 44 (*)    ALT 55 (*)    Total Bilirubin 1.4 (*)    Indirect Bilirubin 1.2 (*)    All other components within normal limits  GLUCOSE, CAPILLARY - Abnormal; Notable for the following components:   Glucose-Capillary 278 (*)    All other components within normal limits  GLUCOSE, CAPILLARY - Abnormal; Notable for the following components:   Glucose-Capillary 201 (*)    All other components within normal limits  CBG MONITORING, ED - Abnormal; Notable for the following components:   Glucose-Capillary 317 (*)    All other components within normal limits  CBG MONITORING, ED - Abnormal; Notable for the following components:   Glucose-Capillary 198 (*)    All other components within normal limits  LIPASE, BLOOD  CK  HEMOGLOBIN A1C     EKG     RADIOLOGY  US Abdomen Limited RUQ (LIVER/GB) Result Date: 01/11/2024 CLINICAL DATA:  Abdominal pain. EXAM: ULTRASOUND ABDOMEN LIMITED RIGHT UPPER QUADRANT COMPARISON:  CT abdomen pelvis dated 01/11/2024. FINDINGS: Gallbladder: Multiple gallstones measure up to 1 cm. No gallbladder wall thickening or pericholecystic fluid. Negative sonographic Murphy's sign. Common bile duct: Diameter: 3 mm Liver: There is diffuse increased liver echogenicity most commonly seen in the setting of fatty infiltration. The liver is enlarged measuring approximately 17 cm in midclavicular length. Portal vein is patent on color Doppler imaging with normal direction of blood flow towards the liver. Other: None. IMPRESSION: 1. Cholelithiasis without sonographic evidence of acute  cholecystitis. 2. Enlarged fatty liver suspicious for steatohepatitis. Correlation with LFTs recommended. Electronically Signed   By: Elgie Collard M.D.   On: 01/11/2024 17:41   CT ABDOMEN PELVIS W CONTRAST Result Date: 01/11/2024 CLINICAL DATA:  Abdominal pain.  Concern for bowel obstruction. EXAM: CT ABDOMEN AND PELVIS WITH CONTRAST  TECHNIQUE: Multidetector CT imaging of the abdomen and pelvis was performed using the standard protocol following bolus administration of intravenous contrast. RADIATION DOSE REDUCTION: This exam was performed according to the departmental dose-optimization program which includes automated exposure control, adjustment of the mA and/or kV according to patient size and/or use of iterative reconstruction technique. CONTRAST:  OMNIPAQUE IOHEXOL 300 MG/ML  SOLN COMPARISON:  CT abdomen pelvis dated 06/12/2023. FINDINGS: Lower chest: The visualized lung bases are clear. No intra-abdominal free air or free fluid. Hepatobiliary: Severe fatty liver. No biliary dilatation. Gallstones. No pericholecystic fluid or evidence of acute cholecystitis by CT. Pancreas: Unremarkable. No pancreatic ductal dilatation or surrounding inflammatory changes. Spleen: Normal in size without focal abnormality. Adrenals/Urinary Tract: The adrenal glands are unremarkable. The kidneys, visualized ureters, and urinary bladder appear unremarkable. Stomach/Bowel: Moderate stool throughout the colon. There is no bowel obstruction or active inflammation. The appendix is normal. Vascular/Lymphatic: The abdominal aorta and IVC are unremarkable. No portal venous gas. There is no adenopathy. Reproductive: The prostate and seminal vesicles are grossly unremarkable. No pelvic mass. Other: None Musculoskeletal: No acute osseous pathology.  T12 hemangioma. IMPRESSION: 1. No acute intra-abdominal or pelvic pathology. 2. Moderate colonic stool burden. No bowel obstruction. Normal appendix. 3. Severe fatty liver. 4.  Cholelithiasis. Electronically Signed   By: Elgie Collard M.D.   On: 01/11/2024 11:32      PROCEDURES:  Critical Care performed: No  Procedures   MEDICATIONS ORDERED IN ED: Medications  acetaminophen (TYLENOL) tablet 650 mg (has no administration in time range)    Or  acetaminophen (TYLENOL) suppository 650 mg (has no administration in time range)  ondansetron (ZOFRAN) tablet 4 mg ( Oral See Alternative 01/12/24 0252)    Or  ondansetron (ZOFRAN) injection 4 mg (4 mg Intravenous Given 01/12/24 0252)  heparin injection 5,000 Units (5,000 Units Subcutaneous Given 01/12/24 0552)  morphine (PF) 2 MG/ML injection 2 mg (has no administration in time range)  morphine (PF) 4 MG/ML injection 4 mg (4 mg Intravenous Given 01/12/24 0255)  insulin aspart (novoLOG) injection 0-6 Units (1 Units Subcutaneous Given 01/11/24 1608)  insulin aspart (novoLOG) injection 0-5 Units (3 Units Subcutaneous Given 01/11/24 2152)  nicotine (NICODERM CQ - dosed in mg/24 hours) patch 21 mg (has no administration in time range)  0.9 %  sodium chloride infusion ( Intravenous Infusion Verify 01/11/24 1859)  hydrALAZINE (APRESOLINE) injection 5 mg (has no administration in time range)  Oral care mouth rinse (has no administration in time range)  sodium chloride 0.9 % bolus 1,000 mL (0 mLs Intravenous Stopped 01/11/24 1550)  ondansetron (ZOFRAN) injection 4 mg (4 mg Intravenous Given 01/11/24 0819)  morphine (PF) 4 MG/ML injection 4 mg (4 mg Intravenous Given 01/11/24 0818)  iohexol (OMNIPAQUE) 300 MG/ML solution 100 mL (100 mLs Intravenous Contrast Given 01/11/24 0944)  sodium chloride 0.9 % bolus 1,000 mL (0 mLs Intravenous Stopped 01/11/24 1550)  ondansetron (ZOFRAN) injection 4 mg (4 mg Intravenous Given 01/11/24 1411)     IMPRESSION / MDM / ASSESSMENT AND PLAN / ED COURSE  I reviewed the triage vital signs and the nursing notes.                              Differential diagnosis includes, but is not limited to, flare of  ulcerative colitis, gastritis, viral illness, diverticulitis cholelithiasis acute hepatitis pancreatitis etc.  Differential diagnosis quite broad based on his previous history and is fairly unremitting nausea,  will trial hydration Zofran and also plan to obtain imaging to further evaluate.    Patient's presentation is most consistent with acute complicated illness / injury requiring diagnostic workup.      Clinical Course as of 01/12/24 0738  Thu Jan 11, 2024  0907 Radiology downtime procedurei n place. I can not review imaging independently at this time.  [MQ]  1021 CT prelim read by Dr. Roda Shutters " hepatic steatosis, cholelithiasis, no biliary dilatation.  No bowel obstruction or inflammatory changes." [MQ]  1404 Admission consult discussed with Dr. Sedalia Muta [MQ]    Clinical Course User Index [MQ] Sharyn Creamer, MD   Patient admitted due to concerns of new onset diabetes symptomatic polyuria polydipsia nausea vomiting, also concerning imaging findings for potential chronic or ongoing inflammatory or other injuries process to the liver is considered as well.  Questions are could be some sort of genetic component patient also reports his father had cirrhosis  FINAL CLINICAL IMPRESSION(S) / ED DIAGNOSES   Final diagnoses:  Diabetes mellitus, new onset (HCC)  Nausea and vomiting, unspecified vomiting type  Abnormal liver enzymes     Rx / DC Orders   ED Discharge Orders     None        Note:  This document was prepared using Dragon voice recognition software and may include unintentional dictation errors.   Sharyn Creamer, MD 01/12/24 [phone]

## 2024-01-11 NOTE — Assessment & Plan Note (Signed)
 Extensive counseling regarding healthy diet including colorful vegetable that can be baked or steamed. Avoid high carb food such as white bread, white potato, white Pasta Consume lean protein such as baked chicken breast and salmon, tilapia Avoid fried foods such as fried chicken, fried pork chops, chicken fried steak, and fatty foods such as brisket Patient endorses understanding and compliance

## 2024-01-11 NOTE — Assessment & Plan Note (Signed)
 With elevated ALT

## 2024-01-11 NOTE — H&P (Signed)
 History and Physical   Brent [name] ZOX:[id number] DOB: 09-17-1986 DOA: 01/11/2024  PCP: Pcp, No  Patient coming from: Home  I have personally briefly reviewed patient's old medical records in [facility] EMR.  Chief Concern: Abdominal pain  HPI: [name] is a 38 year old male with history of obesity, ulcerative colitis, pancreatitis, currently not on ulcerative colitis infusion due to inability to afford financial cost, who presents emergency department for chief concerns of generalized abdominal pain  Vitals in the ED showed temperature 98.5, respiration rate 20, heart rate 100, blood pressure 141/87, SpO2 98% on room air.  Serum sodium is 134, potassium 4.6, chloride 102, bicarb 20, BUN of 16, serum creatinine of 0.95, EGFR greater than 60, nonfasting glucose 351, WBC 10.6, hemoglobin 17.5, platelets of 196.  T. bili was elevated at 1.5.  Lipase was 31.  CK is pending.  ED treatment: Morphine 4 mg IV one-time dose, sodium chloride 2 L bolus, ondansetron 4 mg IV one-time dose. -------------------------------------- At bedside, patient able to tell me his first and last name, age, location, current calendar year.  He reports he has been having generalized abdominal pain for a while.  He reports it has been worsening over the last few days prompting him to come to the emergency department.  He did states that sometimes he gets pain after meals.  He reports that he occasionally gets dizzy midday.  He reports sometimes he wakes up feeling nauseous and wants to vomit however he does not vomit.  He denies chest pain, shortness of breath, trauma to his person.  He denies fever, chills, syncope, swelling of his lower extremities.  He said over the last 1 to 2 months, he has been having increasing thirst and he urinates a lot.  He reports that he drinks about half a gallon of water every day because he is always thirsty.  Reports that he does not have a great diet.  He infrequently  consumes high fat high sugar diets.  Social history: He lives at home with his fiance and 2 children.  He denies tobacco smoking.  He endorses he vapes nicotine occasionally.  He infrequently uses EtOH.  He denies IV recreational drug use.  He reports that he smokes marijuana occasionally.  ROS: Constitutional: no weight change, no fever ENT/Mouth: no sore throat, no rhinorrhea Eyes: no eye pain, no vision changes Cardiovascular: no chest pain, no dyspnea,  no edema, no palpitations Respiratory: no cough, no sputum, no wheezing Gastrointestinal: no nausea, no vomiting, no diarrhea, no constipation Genitourinary: no urinary incontinence, no dysuria, no hematuria Musculoskeletal: no arthralgias, no myalgias Skin: no skin lesions, no pruritus, Neuro: + weakness, no loss of consciousness, no syncope Psych: no anxiety, no depression, + decrease appetite Heme/Lymph: no bruising, no bleeding  ED Course: Discussed with EDP, patient requiring hospitalization for chief concerns of new onset diabetes mellitus.  Assessment/Plan  Principal Problem:   Generalized abdominal cramping Active Problems:   Adult ADHD   Depression   BMI 31.0-31.9,adult   Gastroesophageal reflux disease without esophagitis   Nicotine dependence, other tobacco product, uncomplicated   Serum total bilirubin elevated   Elevated LFTs   Hyperglycemia   Hyperglycemia due to diabetes mellitus (HCC)   Essential hypertension   Hepatic steatosis   Deficient knowledge of diet   Assessment and Plan:  * Generalized abdominal cramping Etiology workup in progress Right upper quadrant ultrasound has been ordered Symptomatic support: Morphine 2 mg IV every 4 hours as needed for moderate pain,  1 day ordered; morphine 4 mg IV every 4 hours as needed for severe pain, 20 hours of coverage ordered  Deficient knowledge of diet Extensive counseling regarding healthy diet including colorful vegetable that can be baked or  steamed. Avoid high carb food such as white bread, white potato, white Pasta Consume lean protein such as baked chicken breast and salmon, tilapia Avoid fried foods such as fried chicken, fried pork chops, chicken fried steak, and fatty foods such as brisket Patient endorses understanding and compliance  Hepatic steatosis With elevated ALT  Essential hypertension With no prior diagnosis Suspect metabolic syndrome component given patient's obesity, and insulin resistance/glucose intolerance/new onset diabetes mellitus Hydralazine 5 mg IV every 6 hours as needed for SBP greater 160, 5 days ordered  Hyperglycemia due to diabetes mellitus (HCC) Suspected  Hyperglycemia No prior medical diagnoses of diabetes mellitus Patient blood sugar of 351 Check A1c on admission Insulin SSI with at bedtime coverage, very sensitive dosing ordered as patient is insulin nave Monitor CBG 3 times daily with AC Status post sodium chloride 1 L bolus per EDP Continue with sodium chloride infusion at 125 mL/h, 1 day ordered  Elevated LFTs With postprandial abdominal discomfort, ultrasound of the right upper quadrant ordered on admission  Serum total bilirubin elevated Right upper quadrant ultrasound has been ordered  BMI 31.0-31.9,adult This complicates overall care and prognosis.   DVT prophylaxis: Heparin 5000 units subcutaneous every 8 hours Code Status: Full code Diet: N.p.o. except for sips with meds and ice chips with orders to advance as tolerated to heart healthy/carb modified diet Family Communication: updated finacee at bedside with patient's permission Disposition Plan: Pending clinical course Consults called: Diabetes coordinator Admission status: Telemetry medical, observation  Past Medical History:  Diagnosis Date   Ulcerative colitis (HCC)    Past Surgical History:  Procedure Laterality Date   COLONOSCOPY WITH PROPOFOL N/A 01/01/2021   Procedure: COLONOSCOPY WITH PROPOFOL;   Surgeon: Toney Reil, MD;  [facility] ENDOSCOPY;  Service: Gastroenterology;  Laterality: N/A;   TYMPANOSTOMY TUBE PLACEMENT     Social History:  reports that he has been smoking cigarettes. He has never used smokeless tobacco. He reports current alcohol use. He reports that he does not use drugs.  Allergies  Allergen Reactions   Wasp Venom Protein Shortness Of Breath   Family History  Problem Relation Age of Onset   Pancreatitis Father    Family history: Family history reviewed and not pertinent.  Prior to Admission medications   Medication Sig Start Date End Date Taking? Authorizing Provider  ipratropium (ATROVENT) 0.03 % nasal spray Place 2 sprays into both nostrils every 12 (twelve) hours. 07/07/23   White, Elita Boone, NP  omeprazole (PRILOSEC OTC) 20 MG tablet Take 40 mg by mouth daily.    [provider]  ondansetron (ZOFRAN-ODT) 4 MG disintegrating tablet Take 1 tablet (4 mg total) by mouth every 8 (eight) hours as needed for nausea or vomiting. 07/07/23   Valinda Hoar, NP  predniSONE (DELTASONE) 20 MG tablet Take 2 tablets (40 mg total) by mouth daily. 07/07/23   Valinda Hoar, NP   Physical Exam: Vitals:   01/11/24 0740 01/11/24 0741 01/11/24 1156 01/11/24 1551  BP: 123/70  (!) 141/87   Pulse: (!) 107  100   Resp: 20  20   Temp: 98 F (36.7 C)  98.5 F (36.9 C) 98.1 F (36.7 C)  TempSrc: Oral  Oral Oral  SpO2: 97%  98%   Weight:  99.8 kg    Height:  5\' 10"  (1.778 m)     Constitutional: appears age-appropriate, NAD, calm Eyes: PERRL, lids and conjunctivae normal ENMT: Mucous membranes are moist. Posterior pharynx clear of any exudate or lesions. Age-appropriate dentition. Hearing appropriate Neck: normal, supple, no masses, no thyromegaly Respiratory: clear to auscultation bilaterally, no wheezing, no crackles. Normal respiratory effort. No accessory muscle use.  Cardiovascular: Regular rate and rhythm, no murmurs / rubs / gallops. No  extremity edema. 2+ pedal pulses. No carotid bruits.  Abdomen: no tenderness, no masses palpated, no hepatosplenomegaly. Bowel sounds positive.  Musculoskeletal: no clubbing / cyanosis. No joint deformity upper and lower extremities. Good ROM, no contractures, no atrophy. Normal muscle tone.  Skin: no rashes, lesions, ulcers. No induration Neurologic: Sensation intact. Strength 5/5 in all 4.  Psychiatric: Normal judgment and insight. Alert and oriented x 3. Normal mood.   EKG: Not indicated at this time  Chest x-ray on Admission: Not indicated at this time  CT ABDOMEN PELVIS W CONTRAST Result Date: 01/11/2024 CLINICAL DATA:  Abdominal pain.  Concern for bowel obstruction. EXAM: CT ABDOMEN AND PELVIS WITH CONTRAST TECHNIQUE: Multidetector CT imaging of the abdomen and pelvis was performed using the standard protocol following bolus administration of intravenous contrast. RADIATION DOSE REDUCTION: This exam was performed according to the departmental dose-optimization program which includes automated exposure control, adjustment of the mA and/or kV according to patient size and/or use of iterative reconstruction technique. CONTRAST:  OMNIPAQUE IOHEXOL 300 MG/ML  SOLN COMPARISON:  CT abdomen pelvis dated 06/12/2023. FINDINGS: Lower chest: The visualized lung bases are clear. No intra-abdominal free air or free fluid. Hepatobiliary: Severe fatty liver. No biliary dilatation. Gallstones. No pericholecystic fluid or evidence of acute cholecystitis by CT. Pancreas: Unremarkable. No pancreatic ductal dilatation or surrounding inflammatory changes. Spleen: Normal in size without focal abnormality. Adrenals/Urinary Tract: The adrenal glands are unremarkable. The kidneys, visualized ureters, and urinary bladder appear unremarkable. Stomach/Bowel: Moderate stool throughout the colon. There is no bowel obstruction or active inflammation. The appendix is normal. Vascular/Lymphatic: The abdominal aorta and IVC are  unremarkable. No portal venous gas. There is no adenopathy. Reproductive: The prostate and seminal vesicles are grossly unremarkable. No pelvic mass. Other: None Musculoskeletal: No acute osseous pathology.  T12 hemangioma. IMPRESSION: 1. No acute intra-abdominal or pelvic pathology. 2. Moderate colonic stool burden. No bowel obstruction. Normal appendix. 3. Severe fatty liver. 4. Cholelithiasis. Electronically Signed   By: Elgie Collard M.D.   On: 01/11/2024 11:32   Labs on Admission: I have personally reviewed following labs  CBC: Recent Labs  Lab 01/11/24 0754 01/11/24 1037  WBC 11.7* 10.6*  HGB 18.6* 17.5*  HCT RESULTS UNAVAILABLE DUE TO INTERFERING SUBSTANCE 46.9  MCV RESULTS UNAVAILABLE DUE TO INTERFERING SUBSTANCE 85.7  PLT 247 196   Basic Metabolic Panel: Recent Labs  Lab 01/11/24 0853  NA 134*  K 4.6  CL 102  CO2 20*  GLUCOSE 351*  BUN 16  CREATININE 0.95  CALCIUM 9.0   GFR: Estimated Creatinine Clearance: 128.5 mL/min (by C-G formula based on SCr of 0.95 mg/dL).  Liver Function Tests: Recent Labs  Lab 01/11/24 0853  AST 39  ALT 66*  ALKPHOS 84  BILITOT 1.5*  PROT 8.0  ALBUMIN 4.6   Recent Labs  Lab 01/11/24 0853  LIPASE 31   CBG: Recent Labs  Lab 01/11/24 1041 01/11/24 1603  GLUCAP 317* 198*   Lipid Profile: Recent Labs    01/11/24 1037  CHOL 87  HDL 24*  LDLCALC 5  TRIG 290*  CHOLHDL 3.6   Urine analysis:    Component Value Date/Time   COLORURINE YELLOW 06/12/2023 2149   APPEARANCEUR CLEAR 06/12/2023 2149   LABSPEC 1.010 06/12/2023 2149   PHURINE 7.0 06/12/2023 2149   GLUCOSEU NEGATIVE 06/12/2023 2149   HGBUR TRACE (A) 06/12/2023 2149   BILIRUBINUR NEGATIVE 06/12/2023 2149   KETONESUR 15 (A) 06/12/2023 2149   PROTEINUR NEGATIVE 06/12/2023 2149   NITRITE NEGATIVE 06/12/2023 2149   LEUKOCYTESUR NEGATIVE 06/12/2023 2149   This document was prepared using Dragon Voice Recognition software and may include unintentional dictation  errors.  Dr. Sedalia Muta [facility]  If 7PM-7AM, please contact overnight-coverage provider If 7AM-7PM, please contact day attending provider [email or web address]  01/11/2024, 4:50 PM

## 2024-01-11 NOTE — Assessment & Plan Note (Signed)
 With postprandial abdominal discomfort, ultrasound of the right upper quadrant ordered on admission

## 2024-01-11 NOTE — Assessment & Plan Note (Signed)
 Right upper quadrant ultrasound has been ordered

## 2024-01-11 NOTE — ED Triage Notes (Signed)
 Pt w hx of colitis. Has been vomiting since approx midnight. Has taken ODT zofran with no relief.

## 2024-01-11 NOTE — Hospital Course (Addendum)
 Brent [name] is a 38 year old male with history of obesity, ulcerative colitis, pancreatitis, currently not on ulcerative colitis infusion due to inability to afford financial cost, who presents emergency department for chief concerns of generalized abdominal pain and nausea vomiting.  This has improved, patient received fluids, electrolytes normalized.  Medically stable for discharge.

## 2024-01-11 NOTE — Plan of Care (Signed)
  Problem: Education: Goal: Ability to describe self-care measures that may prevent or decrease complications (Diabetes Survival Skills Education) will improve Outcome: Progressing   Problem: Coping: Goal: Ability to adjust to condition or change in health will improve Outcome: Progressing   Problem: Fluid Volume: Goal: Ability to maintain a balanced intake and output will improve Outcome: Progressing   Problem: Health Behavior/Discharge Planning: Goal: Ability to identify and utilize available resources and services will improve Outcome: Progressing   Problem: Metabolic: Goal: Ability to maintain appropriate glucose levels will improve Outcome: Progressing   Problem: Nutritional: Goal: Maintenance of adequate nutrition will improve Outcome: Progressing   Problem: Skin Integrity: Goal: Risk for impaired skin integrity will decrease Outcome: Progressing   Problem: Tissue Perfusion: Goal: Adequacy of tissue perfusion will improve Outcome: Progressing   Problem: Education: Goal: Knowledge of General Education information will improve Description: Including pain rating scale, medication(s)/side effects and non-pharmacologic comfort measures Outcome: Progressing   Problem: Nutrition: Goal: Adequate nutrition will be maintained Outcome: Progressing   Problem: Coping: Goal: Level of anxiety will decrease Outcome: Progressing   Problem: Elimination: Goal: Will not experience complications related to bowel motility Outcome: Progressing   Problem: Pain Managment: Goal: General experience of comfort will improve and/or be controlled Outcome: Progressing   Problem: Safety: Goal: Ability to remain free from injury will improve Outcome: Progressing   Problem: Skin Integrity: Goal: Risk for impaired skin integrity will decrease Outcome: Progressing

## 2024-01-11 NOTE — Assessment & Plan Note (Signed)
 -  This complicates overall care and prognosis.

## 2024-01-11 NOTE — Assessment & Plan Note (Signed)
 With no prior diagnosis Suspect metabolic syndrome component given patient's obesity, and insulin resistance/glucose intolerance/new onset diabetes mellitus Hydralazine 5 mg IV every 6 hours as needed for SBP greater 160, 5 days ordered

## 2024-01-11 NOTE — Assessment & Plan Note (Addendum)
 No prior medical diagnoses of diabetes mellitus Patient blood sugar of 351 Check A1c on admission Insulin SSI with at bedtime coverage, very sensitive dosing ordered as patient is insulin nave Monitor CBG 3 times daily with AC Status post sodium chloride 1 L bolus per EDP Continue with sodium chloride infusion at 125 mL/h, 1 day ordered

## 2024-01-12 ENCOUNTER — Other Ambulatory Visit (HOSPITAL_COMMUNITY): Payer: Self-pay

## 2024-01-12 DIAGNOSIS — F17210 Nicotine dependence, cigarettes, uncomplicated: Secondary | ICD-10-CM | POA: Diagnosis present

## 2024-01-12 DIAGNOSIS — E871 Hypo-osmolality and hyponatremia: Secondary | ICD-10-CM | POA: Diagnosis present

## 2024-01-12 DIAGNOSIS — E872 Acidosis, unspecified: Secondary | ICD-10-CM | POA: Diagnosis present

## 2024-01-12 DIAGNOSIS — E66811 Obesity, class 1: Secondary | ICD-10-CM | POA: Diagnosis present

## 2024-01-12 DIAGNOSIS — K8020 Calculus of gallbladder without cholecystitis without obstruction: Secondary | ICD-10-CM | POA: Diagnosis present

## 2024-01-12 DIAGNOSIS — R109 Unspecified abdominal pain: Secondary | ICD-10-CM | POA: Diagnosis present

## 2024-01-12 DIAGNOSIS — K219 Gastro-esophageal reflux disease without esophagitis: Secondary | ICD-10-CM | POA: Diagnosis present

## 2024-01-12 DIAGNOSIS — E1165 Type 2 diabetes mellitus with hyperglycemia: Secondary | ICD-10-CM

## 2024-01-12 DIAGNOSIS — R112 Nausea with vomiting, unspecified: Secondary | ICD-10-CM | POA: Diagnosis present

## 2024-01-12 DIAGNOSIS — F909 Attention-deficit hyperactivity disorder, unspecified type: Secondary | ICD-10-CM | POA: Diagnosis present

## 2024-01-12 DIAGNOSIS — R1084 Generalized abdominal pain: Secondary | ICD-10-CM | POA: Diagnosis not present

## 2024-01-12 DIAGNOSIS — K7581 Nonalcoholic steatohepatitis (NASH): Secondary | ICD-10-CM | POA: Diagnosis present

## 2024-01-12 DIAGNOSIS — Z6831 Body mass index (BMI) 31.0-31.9, adult: Secondary | ICD-10-CM | POA: Diagnosis not present

## 2024-01-12 DIAGNOSIS — K5190 Ulcerative colitis, unspecified, without complications: Secondary | ICD-10-CM | POA: Diagnosis present

## 2024-01-12 DIAGNOSIS — E88819 Insulin resistance, unspecified: Secondary | ICD-10-CM | POA: Diagnosis present

## 2024-01-12 DIAGNOSIS — I10 Essential (primary) hypertension: Secondary | ICD-10-CM | POA: Diagnosis present

## 2024-01-12 DIAGNOSIS — Z91141 Patient's other noncompliance with medication regimen due to financial hardship: Secondary | ICD-10-CM | POA: Diagnosis not present

## 2024-01-12 DIAGNOSIS — Z794 Long term (current) use of insulin: Secondary | ICD-10-CM | POA: Diagnosis not present

## 2024-01-12 LAB — CBC
HCT: 42.6 % (ref 39.0–52.0)
Hemoglobin: 15.8 g/dL (ref 13.0–17.0)
MCH: 31.3 pg (ref 26.0–34.0)
MCHC: 37.1 g/dL — ABNORMAL HIGH (ref 30.0–36.0)
MCV: 84.5 fL (ref 80.0–100.0)
Platelets: 178 10*3/uL (ref 150–400)
RBC: 5.04 MIL/uL (ref 4.22–5.81)
RDW: 12.2 % (ref 11.5–15.5)
WBC: 7.1 10*3/uL (ref 4.0–10.5)
nRBC: 0 % (ref 0.0–0.2)

## 2024-01-12 LAB — BASIC METABOLIC PANEL WITH GFR
Anion gap: 9 (ref 5–15)
BUN: 15 mg/dL (ref 6–20)
CO2: 19 mmol/L — ABNORMAL LOW (ref 22–32)
Calcium: 8.1 mg/dL — ABNORMAL LOW (ref 8.9–10.3)
Chloride: 106 mmol/L (ref 98–111)
Creatinine, Ser: 0.81 mg/dL (ref 0.61–1.24)
GFR, Estimated: >60 mL/min (ref >60)
Glucose, Bld: 231 mg/dL — ABNORMAL HIGH (ref 70–99)
Potassium: 4 mmol/L (ref 3.5–5.1)
Sodium: 134 mmol/L — ABNORMAL LOW (ref 135–145)

## 2024-01-12 LAB — HEPATIC FUNCTION PANEL
ALT: 55 U/L — ABNORMAL HIGH (ref 0–44)
AST: 44 U/L — ABNORMAL HIGH (ref 15–41)
Albumin: 3.8 g/dL (ref 3.5–5.0)
Alkaline Phosphatase: 64 U/L (ref 38–126)
Bilirubin, Direct: 0.2 mg/dL (ref 0.0–0.2)
Indirect Bilirubin: 1.2 mg/dL — ABNORMAL HIGH (ref 0.3–0.9)
Total Bilirubin: 1.4 mg/dL — ABNORMAL HIGH (ref 0.0–1.2)
Total Protein: 6.5 g/dL (ref 6.5–8.1)

## 2024-01-12 LAB — GLUCOSE, CAPILLARY
Glucose-Capillary: 201 mg/dL — ABNORMAL HIGH (ref 70–99)
Glucose-Capillary: 247 mg/dL — ABNORMAL HIGH (ref 70–99)
Glucose-Capillary: 274 mg/dL — ABNORMAL HIGH (ref 70–99)
Glucose-Capillary: 266 mg/dL — ABNORMAL HIGH (ref 70–99)

## 2024-01-12 MED ORDER — SENNOSIDES-DOCUSATE SODIUM 8.6-50 MG PO TABS
2.0000 | ORAL_TABLET | Freq: Two times a day (BID) | ORAL | Status: DC
  Filled 2024-01-12 (×2): qty 2

## 2024-01-12 MED ORDER — LIVING WELL WITH DIABETES BOOK
Freq: Once | Status: AC
  Filled 2024-01-12: qty 1

## 2024-01-12 MED ORDER — SODIUM BICARBONATE 650 MG PO TABS
1300.0000 mg | ORAL_TABLET | Freq: Two times a day (BID) | ORAL | Status: DC
  Administered 2024-01-12: 1300 mg via ORAL
  Filled 2024-01-12: qty 2

## 2024-01-12 MED ORDER — INSULIN GLARGINE-YFGN 100 UNIT/ML ~~LOC~~ SOLN
12.0000 [IU] | SUBCUTANEOUS | Status: DC
  Administered 2024-01-12: 12 [IU] via SUBCUTANEOUS
  Filled 2024-01-12 (×2): qty 0.12

## 2024-01-12 MED ORDER — SODIUM CHLORIDE 0.45 % IV SOLN
INTRAVENOUS | Status: AC
  Filled 2024-01-12: qty 75

## 2024-01-12 MED ORDER — INSULIN ASPART 100 UNIT/ML IJ SOLN
4.0000 [IU] | Freq: Three times a day (TID) | INTRAMUSCULAR | Status: DC
  Administered 2024-01-12 – 2024-01-13 (×3): 4 [IU] via SUBCUTANEOUS
  Filled 2024-01-12 (×3): qty 1

## 2024-01-12 NOTE — Progress Notes (Signed)
 Brief Nutrition Education Note  RD consulted for nutrition education regarding diabetes.   Pt with new onset DM. Pt in with DM coordinator at time of visit.  RD provided "Carbohydrate Counting for People with Diabetes" and "Plate Method" handouts from the Academy of Nutrition and Dietetics. Attached to AVS/ discharge summary.   RD also referred pt to [facility]'s Nutrition and Diabetes Education Services for further education and support at discharge.   Body mass index is 31.57 kg/m. Pt meets criteria for obesity, class I based on current BMI. Obesity is a complex, chronic medical condition that is optimally managed by a multidisciplinary care team. Weight loss is not an ideal goal for an acute inpatient hospitalization. However, if further work-up for obesity is warranted, consider outpatient referral to [facility] and Diabetes [facility].    Current diet order is heart healthy/ carb modified, patient is consuming approximately n/a% of meals at this time. Labs and medications reviewed. No further nutrition interventions warranted at this time. RD contact information provided. If additional nutrition issues arise, please re-consult RD.  Levada Schilling, RD, LDN, CDCES Registered Dietitian III Certified Diabetes Care and Education Specialist If unable to reach this RD, please use "RD Inpatient" group chat on secure chat between hours of 8am-4 pm daily

## 2024-01-12 NOTE — Progress Notes (Signed)
 Attempted to round on patient to introduce role of nurse navigator. Patient currently receiving education from diabetes coordinator. Will re-attempt at a later time.

## 2024-01-12 NOTE — TOC Benefit Eligibility Note (Signed)
 Pharmacy Patient Advocate Encounter  Insurance verification completed.    The patient is insured through  [employer] .     Ran test claim for Insulin aspart flexpen and the current 30 day co-pay is $0.  Ran test claim for [employer] 3 Plus sensor and the current 30 day co-pay is $20.  Ran test claim for Dexcom G7 sensor and the current 30 day co-pay is $20.  This test claim was processed through [employer]- copay amounts may vary at other pharmacies due to [employer], or as the patient moves through the different stages of their insurance plan.

## 2024-01-12 NOTE — Plan of Care (Signed)
  Problem: Education: Goal: Ability to describe self-care measures that may prevent or decrease complications (Diabetes Survival Skills Education) will improve Outcome: Progressing Goal: Individualized Educational Video(s) Outcome: Progressing   Problem: Coping: Goal: Ability to adjust to condition or change in health will improve Outcome: Progressing   Problem: Health Behavior/Discharge Planning: Goal: Ability to identify and utilize available resources and services will improve Outcome: Progressing Goal: Ability to manage health-related needs will improve Outcome: Progressing   Problem: Metabolic: Goal: Ability to maintain appropriate glucose levels will improve Outcome: Progressing   Problem: Nutritional: Goal: Maintenance of adequate nutrition will improve Outcome: Progressing Goal: Progress toward achieving an optimal weight will improve Outcome: Progressing   Problem: Skin Integrity: Goal: Risk for impaired skin integrity will decrease Outcome: Progressing   Problem: Tissue Perfusion: Goal: Adequacy of tissue perfusion will improve Outcome: Progressing

## 2024-01-12 NOTE — Inpatient Diabetes Management (Addendum)
 Inpatient Diabetes Program Recommendations  AACE/ADA: New Consensus Statement on Inpatient Glycemic Control   Target Ranges:  Prepandial:   less than 140 mg/dL      Peak postprandial:   less than 180 mg/dL (1-2 hours)      Critically ill patients:  140 - 180 mg/dL    Latest Reference Range & Units 01/11/24 10:41 01/11/24 16:03 01/11/24 21:39 01/12/24 07:35  Glucose-Capillary 70 - 99 mg/dL 161 (H) 096 (H) 045 (H) 201 (H)    Latest Reference Range & Units 01/11/24 08:53 01/12/24 05:54  CO2 22 - 32 mmol/L 20 (L) 19 (L)  Glucose 70 - 99 mg/dL 409 (H) 811 (H)  Anion gap 5 - 15  12 9     Latest Reference Range & Units 01/11/24 08:53  Beta-Hydroxybutyric Acid 0.05 - 0.27 mmol/L 3.57 (H)   Review of Glycemic Control  Diabetes history: No; newly dx with DM2 this admission Outpatient Diabetes medications: NA Current orders for Inpatient glycemic control: Semglee 12 units Q24H, Novolog 4 units TID with meals, Novolog 0-6 units TID with meals, Novolog 0-5 units QHS  Inpatient Diabetes Program Recommendations:    Insulin: Noted Semglee 12 units Q24H and Novolog 4 units TID with meals ordered today.  HbgA1C: A1C in process.  Outpatient DM: At time of discharge please provide Rx for glucose monitoring kit (#[id number]) and FreeStyle Libre 3 sensors [phone]). If patient is discharged on insulin, his insurance covers Generic Toujeo and Generic Aspart insulin pens.  Patient would need Rx for Generic Toujeo pens [phone]), Generic Aspart pens [phone]), and insulin pen needles [phone]).  NOTE: Patient admitted with abdominal cramping pain, hepatic steatosis, essential hypertension, hyperglycemia (new DM dx), elevated LFTs, and total bilirubin elevated. Per H&P, patient has no prior hx of DM and initial lab glucose 351 mg/dl on 01/12/00. Ordered Living Well with DM book, RD consult for diet education, and patient eduction by bedside nursing. Will plan to see patient today regarding new DM dx.  Addendum  01/12/24@14 :05-Spoke with patient and fiance about new diabetes diagnosis.  Patient reports no prior DM hx but has family hx of DM. Patient has not seen a provider in years. Patient does have insurance but no PCP.  Stressed he will need to get PCP for consistent follow up and assistance with DM management. Patient reports that his mother has DM and he has helped her check glucose and gave her insulin in the past.  Explained what an A1C is and informed patient that his current A1C has not resulted. Discussed basic pathophysiology of DM Type 1 and Type 2, basic home care, importance of checking CBGs and maintaining good CBG control to prevent long-term and short-term complications. Reviewed glucose and A1C goals.  Reviewed signs and symptoms of hyperglycemia and hypoglycemia along with treatment for both. Patient states he has been having symptoms of hyperglycemia for 2 weeks. Discussed impact of nutrition, exercise, stress, sickness, and medications on diabetes control. Discussed carb modified diet and informed patient that RD should be talking with him as well while inpatient regarding diet.  Reviewed Living Well with diabetes booklet and encouraged patient to read through entire book. Patient states his mother uses CGM and he would like to use one if possible. Dr. Chipper Herb provided permission to give patient sample of FreeStyle Libre 3 sensors. Educated patient on FreeStyle Libre3 CGM regarding application and changing CGM sensor (alternate every 14 days on back of arms), 1 hour warm-up, how to scan sensor to start a new sensor, and  how to use app to check glucose.  Patient has been given Freestyle Libre3 sensor samples and patient was able to apply sensor to back of left upper arm.  Provided educational packet regarding FreeStyle Libre3 CGM.  Patient plans to use iphone FreeStyle Libre3 app to read FreeStyle Libre sensor and was able to download the app and create an account. Informed patient that it would be requested  that attending provider provide Rx for first month of FreeStyle Libre3 sensors and that he have PCP continue to provide Rx for FreeStyle Libre3 sensors going forward. Asked patient to be sure to let PCP know about Radene Journey and allow provider to review reports from [place] app so the provider can use the information to continue to make adjustments with DM medications if needed. Discussed that currently unclear if he will discharge on oral DM medications or insulin. Informed patient that our team was not on campus over the weekend so I wanted to review insulin. Educated patient and fiance on insulin pen use at home. Reviewed all steps of insulin pen including attachment of needle, 2-unit air shot, dialing up dose, giving injection, removing needle, disposal of sharps, storage of unused insulin, disposal of insulin etc. Patient able to provide successful return demonstration.  Informed patient that RN will be asking him to self-administer insulin to ensure proper technique and ability to administer self insulin shots.   Patient verbalized understanding of information discussed and he states that he has no further questions at this time related to diabetes.   RNs to provide ongoing basic DM education at bedside with this patient and engage patient to actively check blood glucose and administer insulin injections.   Thanks, Orlando Penner, RN, MSN, CDCES Diabetes Coordinator Inpatient Diabetes Program [phone] (Team Pager from 8am to 5pm)

## 2024-01-12 NOTE — Progress Notes (Signed)
  Progress Note   Patient: Brent [name] AOZ:[id number] DOB: 1985-10-30 DOA: 01/11/2024     0 DOS: the patient was seen and examined on 01/12/2024   Brief hospital course: Mr. Basil Buffin is a 38 year old male with history of obesity, ulcerative colitis, pancreatitis, currently not on ulcerative colitis infusion due to inability to afford financial cost, who presents emergency department for chief concerns of generalized abdominal pain and nausea vomiting.    Principal Problem:   Generalized abdominal cramping Active Problems:   Adult ADHD   Depression   BMI 31.0-31.9,adult   Gastroesophageal reflux disease without esophagitis   Nicotine dependence, other tobacco product, uncomplicated   Serum total bilirubin elevated   Elevated LFTs   Hyperglycemia   Hyperglycemia due to diabetes mellitus (HCC)   Essential hypertension   Hepatic steatosis   Deficient knowledge of diet   Normal anion gap metabolic acidosis   Abdominal pain   Assessment and Plan:  * Generalized abdominal cramping Ulcerative colitis. Cholelithiasis without cholecystitis. Nonalcoholic steatohepatitis Had a significant abdominal discomfort and had some nausea vomiting.  No diarrhea.  He has history of ulcerative colitis, he frequently has diarrhea, most recent diarrhea happened 3 days ago. Abdominal cramping pain seem to be better today, he has been tolerating diet.  Uncontrolled type 2 diabetes with hyperglycemia not on insulin. Patient has significant elevation of glucose, never been diagnosed before.  Hemoglobin A1c is pending, anticipating very high level.  Will continue sliding scale insulin, also added NovoLog and scheduled insulin glargine.  Continue to follow closely.  Hyponatremia. Normal anion gap metabolic acidosis. Starvation ketosis. Patient has uncontrolled type 2 diabetes, but her condition is not consistent with DKA with normal anion gap.  Patient has increased ketosis, appears to be  secondary to nausea vomiting and poor p.o. intake. At this point, patient will be given a liter of sodium bicarb drip. Also check magnesium possibly tomorrow.  Essential hypertension Blood pressure currently not elevated.  Continue to follow.  Hyperglycemia due to diabetes mellitus (HCC) Treated diabetes,  BMI 31.0-31.9,adult This complicates overall care and prognosis.       Subjective:  Patient feels better today, no additional nausea vomiting.  Abdominal cramping resolving.  Had a bowel movement yesterday.  Physical Exam: Vitals:   01/11/24 2143 01/12/24 0025 01/12/24 0458 01/12/24 0733  BP: 131/85 123/81 126/77 136/79  Pulse: 83 78 67 73  Resp: 18 18 18 16   Temp: 98.1 F (36.7 C) 97.9 F (36.6 C) 97.6 F (36.4 C) 98.1 F (36.7 C)  TempSrc:  Oral Oral   SpO2: 96% 97% 96% 97%  Weight:      Height:       General exam: Appears calm and comfortable, obese. Respiratory system: Clear to auscultation. Respiratory effort normal. Cardiovascular system: S1 & S2 heard, RRR. No JVD, murmurs, rubs, gallops or clicks. No pedal edema. Gastrointestinal system: Abdomen is nondistended, soft and nontender. No organomegaly or masses felt. Normal bowel sounds heard. Central nervous system: Alert and oriented. No focal neurological deficits. Extremities: Symmetric 5 x 5 power. Skin: No rashes, lesions or ulcers Psychiatry: Judgement and insight appear normal. Mood & affect appropriate.    Data Reviewed:  Lab Results, CT scan results and ultrasound results reviewed.  Family Communication: None  Disposition: Status is: Inpatient Remains inpatient appropriate because: Severity of disease, IV treatment.     Time spent: 50 minutes  Author: Marrion Coy, MD 01/12/2024 11:13 AM  For on call review [email or web address].

## 2024-01-12 NOTE — Discharge Instructions (Addendum)
 Some PCP options in [place] area- not a comprehensive list  [facility]- [phone] [facility]- [phone] [facility]- [phone] [facility]- [phone] [facility]- [phone] [facility]- [phone]  or [facility] Physician Referral Line [phone]   Plate Method for Diabetes   Foods with carbohydrates make your blood glucose level go up. The plate method is a simple way to meal plan and control the amount of carbohydrate you eat.         Use the following guidance to build a healthy plate to control carbohydrates. Divide a 9-inch plate into 3 sections, and consider your beverage the 4th section of your meal: Food Group Examples of Foods/Beverages for This Section of your Meal  Section 1: Non-starchy vegetables Fill  of your plate to include non-starchy vegetables Asparagus, broccoli, brussels sprouts, cabbage, carrots, cauliflower, celery, cucumber, green beans, mushrooms, peppers, salad greens, tomatoes, or zucchini.  Section 2: Protein foods Fill  of your plate to include a lean protein Lean meat, poultry, fish, seafood, cheese, eggs, lean deli meat, tofu, beans, lentils, nuts or nut butters.  Section 3: Carbohydrate foods Fill  of your plate to include carbohydrate foods Whole grains, whole wheat bread, brown rice, whole grain pasta, polenta, corn tortillas, fruit, or starchy vegetables (potatoes, green peas, corn, beans, acorn squash, and butternut squash). One cup of milk also counts as a food that contains carbohydrate.  Section 4: Beverage Choose water or a low-calorie drink for your beverage. Unsweetened tea, coffee, or flavored/sparkling water without added sugar.  Image reprinted with permission from The American Diabetes Association.  Copyright 2022 by the American Diabetes Association.   Copyright 2022  Academy of Nutrition and Dietetics. All rights reserved    Carbohydrate Counting For People With Diabetes  Foods with  carbohydrates make your blood glucose level go up. Learning how to count carbohydrates can help you control your blood glucose levels. First, identify the foods you eat that contain carbohydrates. Then, using the Foods with Carbohydrates chart, determine about how much carbohydrates are in your meals and snacks. Make sure you are eating foods with fiber, protein, and healthy fat along with your carbohydrate foods. Foods with Carbohydrates The following table shows carbohydrate foods that have about 15 grams of carbohydrate each. Using measuring cups, spoons, or a food scale when you first begin learning about carbohydrate counting can help you learn about the portion sizes you typically eat. The following foods have 15 grams carbohydrate each:  Grains 1 slice bread (1 ounce)  1 small tortilla (6-inch size)   large bagel (1 ounce)  1/3 cup pasta or rice (cooked)   hamburger or hot dog bun ( ounce)   cup cooked cereal   to  cup ready-to-eat cereal  2 taco shells (5-inch size) Fruit 1 small fresh fruit ( to 1 cup)   medium banana  17 small grapes (3 ounces)  1 cup melon or berries   cup canned or frozen fruit  2 tablespoons dried fruit (blueberries, cherries, cranberries, raisins)   cup unsweetened fruit juice  Starchy Vegetables  cup cooked beans, peas, corn, potatoes/sweet potatoes   large baked potato (3 ounces)  1 cup acorn or butternut squash  Snack Foods 3 to 6 crackers  8 potato chips or 13 tortilla chips ( ounce to 1 ounce)  3 cups popped popcorn  Dairy 3/4 cup (6 ounces) nonfat plain yogurt, or yogurt with sugar-free sweetener  1 cup milk  1 cup plain rice, soy, coconut or flavored almond milk Sweets  and Desserts  cup ice cream or frozen yogurt  1 tablespoon jam, jelly, pancake syrup, table sugar, or honey  2 tablespoons light pancake syrup  1 inch square of frosted cake or 2 inch square of unfrosted cake  2 small cookies (2/3 ounce each) or  large cookie   Sometimes you'll have to estimate carbohydrate amounts if you don't know the exact recipe. One cup of mixed foods like soups can have 1 to 2 carbohydrate servings, while some casseroles might have 2 or more servings of carbohydrate. Foods that have less than 20 calories in each serving can be counted as "free" foods. Count 1 cup raw vegetables, or  cup cooked non-starchy vegetables as "free" foods. If you eat 3 or more servings at one meal, then count them as 1 carbohydrate serving.  Foods without Carbohydrates  Not all foods contain carbohydrates. Meat, some dairy, fats, non-starchy vegetables, and many beverages don't contain carbohydrate. So when you count carbohydrates, you can generally exclude chicken, pork, beef, fish, seafood, eggs, tofu, cheese, butter, sour cream, avocado, nuts, seeds, olives, mayonnaise, water, black coffee, unsweetened tea, and zero-calorie drinks. Vegetables with no or low carbohydrate include green beans, cauliflower, tomatoes, and onions. How much carbohydrate should I eat at each meal?  Carbohydrate counting can help you plan your meals and manage your weight. Following are some starting points for carbohydrate intake at each meal. Work with your registered dietitian nutritionist to find the best range that works for your blood glucose and weight.   To Lose Weight To Maintain Weight  Women 2 - 3 carb servings 3 - 4 carb servings  Men 3 - 4 carb servings 4 - 5 carb servings  Checking your blood glucose after meals will help you know if you need to adjust the timing, type, or number of carbohydrate servings in your meal plan. Achieve and keep a healthy body weight by balancing your food intake and physical activity.  Tips How should I plan my meals?  Plan for half the food on your plate to include non-starchy vegetables, like salad greens, broccoli, or carrots. Try to eat 3 to 5 servings of non-starchy vegetables every day. Have a protein food at each meal. Protein  foods include chicken, fish, meat, eggs, or beans (note that beans contain carbohydrate). These two food groups (non-starchy vegetables and proteins) are low in carbohydrate. If you fill up your plate with these foods, you will eat less carbohydrate but still fill up your stomach. Try to limit your carbohydrate portion to  of the plate.  What fats are healthiest to eat?  Diabetes increases risk for heart disease. To help protect your heart, eat more healthy fats, such as olive oil, nuts, and avocado. Eat less saturated fats like butter, cream, and high-fat meats, like bacon and sausage. Avoid trans fats, which are in all foods that list "partially hydrogenated oil" as an ingredient. What should I drink?  Choose drinks that are not sweetened with sugar. The healthiest choices are water, carbonated or seltzer waters, and tea and coffee without added sugars.  Sweet drinks will make your blood glucose go up very quickly. One serving of soda or energy drink is  cup. It is best to drink these beverages only if your blood glucose is low.  Artificially sweetened, or diet drinks, typically do not increase your blood glucose if they have zero calories in them. Read labels of beverages, as some diet drinks do have carbohydrate and will raise your blood glucose.  Label Reading Tips Read Nutrition Facts labels to find out how many grams of carbohydrate are in a food you want to eat. Don't forget: sometimes serving sizes on the label aren't the same as how much food you are going to eat, so you may need to calculate how much carbohydrate is in the food you are serving yourself.   Carbohydrate Counting for People with Diabetes Sample 1-Day Menu  Breakfast  cup yogurt, low fat, low sugar (1 carbohydrate serving)   cup cereal, ready-to-eat, unsweetened (1 carbohydrate serving)  1 cup strawberries (1 carbohydrate serving)   cup almonds ( carbohydrate serving)  Lunch 1, 5 ounce can chunk light tuna  2 ounces  cheese, low fat cheddar  6 whole wheat crackers (1 carbohydrate serving)  1 small apple (1 carbohydrate servings)   cup carrots ( carbohydrate serving)   cup snap peas  1 cup 1% milk (1 carbohydrate serving)   Evening Meal Stir fry made with: 3 ounces chicken  1 cup brown rice (3 carbohydrate servings)   cup broccoli ( carbohydrate serving)   cup green beans   cup onions  1 tablespoon olive oil  2 tablespoons teriyaki sauce ( carbohydrate serving)  Evening Snack 1 extra small banana (1 carbohydrate serving)  1 tablespoon peanut butter   Carbohydrate Counting for People with Diabetes Vegan Sample 1-Day Menu  Breakfast 1 cup cooked oatmeal (2 carbohydrate servings)   cup blueberries (1 carbohydrate serving)  2 tablespoons flaxseeds  1 cup soymilk fortified with calcium and vitamin D  1 cup coffee  Lunch 2 slices whole wheat bread (2 carbohydrate servings)   cup baked tofu   cup lettuce  2 slices tomato  2 slices avocado   cup baby carrots ( carbohydrate serving)  1 orange (1 carbohydrate serving)  1 cup soymilk fortified with calcium and vitamin D   Evening Meal Burrito made with: 1 6-inch corn tortilla (1 carbohydrate serving)  1 cup refried vegetarian beans (2 carbohydrate servings)   cup chopped tomatoes   cup lettuce   cup salsa  1/3 cup brown rice (1 carbohydrate serving)  1 tablespoon olive oil for rice   cup zucchini   Evening Snack 6 small whole grain crackers (1 carbohydrate serving)  2 apricots ( carbohydrate serving)   cup unsalted peanuts ( carbohydrate serving)    Carbohydrate Counting for People with Diabetes Vegetarian (Lacto-Ovo) Sample 1-Day Menu  Breakfast 1 cup cooked oatmeal (2 carbohydrate servings)   cup blueberries (1 carbohydrate serving)  2 tablespoons flaxseeds  1 egg  1 cup 1% milk (1 carbohydrate serving)  1 cup coffee  Lunch 2 slices whole wheat bread (2 carbohydrate servings)  2 ounces low-fat cheese   cup lettuce   2 slices tomato  2 slices avocado   cup baby carrots ( carbohydrate serving)  1 orange (1 carbohydrate serving)  1 cup unsweetened tea  Evening Meal Burrito made with: 1 6-inch corn tortilla (1 carbohydrate serving)   cup refried vegetarian beans (1 carbohydrate serving)   cup tomatoes   cup lettuce   cup salsa  1/3 cup brown rice (1 carbohydrate serving)  1 tablespoon olive oil for rice   cup zucchini  1 cup 1% milk (1 carbohydrate serving)  Evening Snack 6 small whole grain crackers (1 carbohydrate serving)  2 apricots ( carbohydrate serving)   cup unsalted peanuts ( carbohydrate serving)    Copyright 2020  Academy of Nutrition and Dietetics. All rights reserved.  Using Nutrition Labels: Carbohydrate  Serving Size  Look at the serving size. All the information on the label is based on this portion. Servings Per Container  The number of servings contained in the package. Guidelines for Carbohydrate  Look at the total grams of carbohydrate in the serving size.  1 carbohydrate choice = 15 grams of carbohydrate. Range of Carbohydrate Grams Per Choice  Carbohydrate Grams/Choice Carbohydrate Choices  6-10   11-20 1  21-25 1  26-35 2  36-40 2  41-50 3  51-55 3  56-65 4  66-70 4  71-80 5    Copyright 2020  Academy of Nutrition and Dietetics. All rights reserved.

## 2024-01-13 DIAGNOSIS — E1165 Type 2 diabetes mellitus with hyperglycemia: Secondary | ICD-10-CM | POA: Diagnosis not present

## 2024-01-13 DIAGNOSIS — E66811 Obesity, class 1: Secondary | ICD-10-CM | POA: Insufficient documentation

## 2024-01-13 DIAGNOSIS — R1084 Generalized abdominal pain: Secondary | ICD-10-CM | POA: Diagnosis not present

## 2024-01-13 DIAGNOSIS — E872 Acidosis, unspecified: Secondary | ICD-10-CM | POA: Diagnosis not present

## 2024-01-13 LAB — HEMOGLOBIN A1C
Hgb A1c MFr Bld: 11.2 % — ABNORMAL HIGH (ref 4.8–5.6)
Mean Plasma Glucose: 275 mg/dL

## 2024-01-13 LAB — BASIC METABOLIC PANEL WITH GFR
Anion gap: 9 (ref 5–15)
BUN: 14 mg/dL (ref 6–20)
CO2: 22 mmol/L (ref 22–32)
Calcium: 8.8 mg/dL — ABNORMAL LOW (ref 8.9–10.3)
Chloride: 104 mmol/L (ref 98–111)
Creatinine, Ser: 0.86 mg/dL (ref 0.61–1.24)
GFR, Estimated: >60 mL/min (ref >60)
Glucose, Bld: 189 mg/dL — ABNORMAL HIGH (ref 70–99)
Potassium: 3.6 mmol/L (ref 3.5–5.1)
Sodium: 135 mmol/L (ref 135–145)

## 2024-01-13 LAB — GLUCOSE, CAPILLARY: Glucose-Capillary: 196 mg/dL — ABNORMAL HIGH (ref 70–99)

## 2024-01-13 LAB — MAGNESIUM: Magnesium: 1.9 mg/dL (ref 1.7–2.4)

## 2024-01-13 LAB — PHOSPHORUS: Phosphorus: 2.8 mg/dL (ref 2.5–4.6)

## 2024-01-13 MED ORDER — INSULIN GLARGINE 100 UNIT/ML SOLOSTAR PEN: 16.0000 [IU] | PEN_INJECTOR | Freq: Every day | SUBCUTANEOUS | 0 refills | Status: AC

## 2024-01-13 MED ORDER — NICOTINE 21 MG/24HR TD PT24
21.0000 mg | MEDICATED_PATCH | Freq: Every day | TRANSDERMAL | 0 refills | Status: AC | PRN
Start: 2024-01-13 — End: ?

## 2024-01-13 MED ORDER — LANCETS MISC: 1.0000 | Freq: Three times a day (TID) | 0 refills | Status: AC

## 2024-01-13 MED ORDER — PEN NEEDLES 31G X 5 MM MISC: 1.0000 | Freq: Three times a day (TID) | 0 refills | Status: AC

## 2024-01-13 MED ORDER — LANCET DEVICE MISC: 1.0000 | Freq: Three times a day (TID) | 0 refills | Status: AC

## 2024-01-13 MED ORDER — INSULIN LISPRO (1 UNIT DIAL) 100 UNIT/ML (KWIKPEN): 4.0000 [IU] | PEN_INJECTOR | Freq: Three times a day (TID) | SUBCUTANEOUS | 0 refills | Status: AC

## 2024-01-13 MED ORDER — BLOOD GLUCOSE MONITORING SUPPL DEVI: 1.0000 | Freq: Three times a day (TID) | 0 refills | Status: AC

## 2024-01-13 MED ORDER — BLOOD GLUCOSE TEST VI STRP: 1.0000 | ORAL_STRIP | Freq: Three times a day (TID) | 0 refills | Status: AC

## 2024-01-13 NOTE — Discharge Summary (Signed)
 Physician Discharge Summary   Patient: Brent [name] MRN: [id number] DOB: 1986-06-10  Admit date:     01/11/2024  Discharge date: 01/13/24  Discharge Physician: Marrion Coy   PCP: Pcp, No   Recommendations at discharge:   Follow-up with PCP in 1 week, TOC to set up.  Discharge Diagnoses: Principal Problem:   Generalized abdominal cramping Active Problems:   Adult ADHD   Depression   BMI 31.0-31.9,adult   Gastroesophageal reflux disease without esophagitis   Nicotine dependence, other tobacco product, uncomplicated   Serum total bilirubin elevated   Elevated LFTs   Hyperglycemia   Hyperglycemia due to diabetes mellitus (HCC)   Essential hypertension   Hepatic steatosis   Deficient knowledge of diet   Normal anion gap metabolic acidosis   Abdominal pain   Class 1 obesity  Resolved Problems:   * No resolved hospital problems. [facility] Course: [name] is a 38 year old male with history of obesity, ulcerative colitis, pancreatitis, currently not on ulcerative colitis infusion due to inability to afford financial cost, who presents emergency department for chief concerns of generalized abdominal pain and nausea vomiting.  This has improved, patient received fluids, electrolytes normalized.  Medically stable for discharge.   Assessment and Plan: * Generalized abdominal cramping Ulcerative colitis. Cholelithiasis without cholecystitis. Nonalcoholic steatohepatitis Had a significant abdominal discomfort and had some nausea vomiting.  No diarrhea.  He has history of ulcerative colitis, he frequently has diarrhea, most recent diarrhea happened 3 days ago. Abdominal cramping pain seem to be better today, he has been tolerating diet. Patient can be followed with PCP, referred to general surgery for cholelithiasis.   Uncontrolled type 2 diabetes with hyperglycemia not on insulin. Patient has significant elevation of glucose, never been diagnosed before.   Hemoglobin A1c is 11.2. Patient has been seen by diabetes coordinator, started on constant glucose monitor.  I have prescribed Lantus, scheduled NovoLog, and sliding scale insulin. Follow-up with PCP as outpatient, TOC to arrange.   Hyponatremia. Normal anion gap metabolic acidosis. Starvation ketosis. Patient has uncontrolled type 2 diabetes, but her condition is not consistent with DKA with normal anion gap.  Patient has increased ketosis, appears to be secondary to nausea vomiting and poor p.o. intake. Received a liter of sodium bicarb drip. Condition has improved.   Essential hypertension Blood pressure currently not elevated.  Follow-up with PCP as outpatient.   Hyperglycemia due to diabetes mellitus (HCC) Treated diabetes,   BMI 31.0-31.9,adult This complicates overall care and prognosis.            Consultants: None Procedures performed: None  Disposition: Home Diet recommendation:  Discharge Diet Orders (From admission, onward)     Start     Ordered   01/13/24 0000  Diet Carb Modified        01/13/24 0953           Carb modified diet DISCHARGE MEDICATION: Allergies as of 01/13/2024       Reactions   Wasp Venom Protein Shortness Of Breath        Medication List     STOP taking these medications    ipratropium 0.03 % nasal spray Commonly known as: ATROVENT   ondansetron 4 MG disintegrating tablet Commonly known as: ZOFRAN-ODT   predniSONE 20 MG tablet Commonly known as: DELTASONE       TAKE these medications    Blood Glucose Monitoring Suppl Devi 1 each by Does not apply route 3 (three) times daily. May dispense any manufacturer covered  by patient's insurance.   BLOOD GLUCOSE TEST STRIPS Strp 1 each by Does not apply route 3 (three) times daily. Use as directed to check blood sugar. May dispense any manufacturer covered by patient's insurance and fits patient's device.   insulin glargine 100 UNIT/ML Solostar Pen Commonly known as:  LANTUS Inject 16 Units into the skin daily. May substitute as needed per insurance.   insulin lispro 100 UNIT/ML KwikPen Commonly known as: HUMALOG Inject 4 Units into the skin 3 (three) times daily with meals. If eating and Blood Glucose (BG) 80 or higher inject 4 units for meal coverage and add correction dose per scale. If not eating, correction dose only. BG <150= 0 unit; BG 150-200= 1 unit; BG 201-250= 2 unit; BG 251-300= 3 unit; BG 301-350= 4 unit; BG 351-400= 5 unit; BG >400= 6 unit and Call Primary Care.   Lancet Device Misc 1 each by Does not apply route 3 (three) times daily. May dispense any manufacturer covered by patient's insurance.   Lancets Misc 1 each by Does not apply route 3 (three) times daily. Use as directed to check blood sugar. May dispense any manufacturer covered by patient's insurance and fits patient's device.   nicotine 21 mg/24hr patch Commonly known as: NICODERM CQ - dosed in mg/24 hours Place 1 patch (21 mg total) onto the skin daily as needed (nicotine craving).   omeprazole 20 MG tablet Commonly known as: PRILOSEC OTC Take 40 mg by mouth daily.   Pen Needles 31G X 5 MM Misc 1 each by Does not apply route 3 (three) times daily. May dispense any manufacturer covered by patient's insurance.        Discharge Exam: Filed Weights   01/11/24 0741  Weight: 99.8 kg   General exam: Appears calm and comfortable  Respiratory system: Clear to auscultation. Respiratory effort normal. Cardiovascular system: S1 & S2 heard, RRR. No JVD, murmurs, rubs, gallops or clicks. No pedal edema. Gastrointestinal system: Abdomen is nondistended, soft and nontender. No organomegaly or masses felt. Normal bowel sounds heard. Central nervous system: Alert and oriented. No focal neurological deficits. Extremities: Symmetric 5 x 5 power. Skin: No rashes, lesions or ulcers Psychiatry: Judgement and insight appear normal. Mood & affect appropriate.    Condition at discharge:  good  The results of significant diagnostics from this hospitalization (including imaging, microbiology, ancillary and laboratory) are listed below for reference.   Imaging Studies: US Abdomen Limited RUQ (LIVER/GB) Result Date: 01/11/2024 CLINICAL DATA:  Abdominal pain. EXAM: ULTRASOUND ABDOMEN LIMITED RIGHT UPPER QUADRANT COMPARISON:  CT abdomen pelvis dated 01/11/2024. FINDINGS: Gallbladder: Multiple gallstones measure up to 1 cm. No gallbladder wall thickening or pericholecystic fluid. Negative sonographic Murphy's sign. Common bile duct: Diameter: 3 mm Liver: There is diffuse increased liver echogenicity most commonly seen in the setting of fatty infiltration. The liver is enlarged measuring approximately 17 cm in midclavicular length. Portal vein is patent on color Doppler imaging with normal direction of blood flow towards the liver. Other: None. IMPRESSION: 1. Cholelithiasis without sonographic evidence of acute cholecystitis. 2. Enlarged fatty liver suspicious for steatohepatitis. Correlation with LFTs recommended. Electronically Signed   By: Elgie Collard M.D.   On: 01/11/2024 17:41   CT ABDOMEN PELVIS W CONTRAST Result Date: 01/11/2024 CLINICAL DATA:  Abdominal pain.  Concern for bowel obstruction. EXAM: CT ABDOMEN AND PELVIS WITH CONTRAST TECHNIQUE: Multidetector CT imaging of the abdomen and pelvis was performed using the standard protocol following bolus administration of intravenous contrast. RADIATION DOSE REDUCTION: This  exam was performed according to the departmental dose-optimization program which includes automated exposure control, adjustment of the mA and/or kV according to patient size and/or use of iterative reconstruction technique. CONTRAST:  OMNIPAQUE IOHEXOL 300 MG/ML  SOLN COMPARISON:  CT abdomen pelvis dated 06/12/2023. FINDINGS: Lower chest: The visualized lung bases are clear. No intra-abdominal free air or free fluid. Hepatobiliary: Severe fatty liver. No biliary  dilatation. Gallstones. No pericholecystic fluid or evidence of acute cholecystitis by CT. Pancreas: Unremarkable. No pancreatic ductal dilatation or surrounding inflammatory changes. Spleen: Normal in size without focal abnormality. Adrenals/Urinary Tract: The adrenal glands are unremarkable. The kidneys, visualized ureters, and urinary bladder appear unremarkable. Stomach/Bowel: Moderate stool throughout the colon. There is no bowel obstruction or active inflammation. The appendix is normal. Vascular/Lymphatic: The abdominal aorta and IVC are unremarkable. No portal venous gas. There is no adenopathy. Reproductive: The prostate and seminal vesicles are grossly unremarkable. No pelvic mass. Other: None Musculoskeletal: No acute osseous pathology.  T12 hemangioma. IMPRESSION: 1. No acute intra-abdominal or pelvic pathology. 2. Moderate colonic stool burden. No bowel obstruction. Normal appendix. 3. Severe fatty liver. 4. Cholelithiasis. Electronically Signed   By: Elgie Collard M.D.   On: 01/11/2024 11:32    Microbiology: Results for orders placed or performed during the hospital encounter of 12/30/20  Resp Panel by RT-PCR (Flu A&B, Covid) Nasopharyngeal Swab     Status: None   Collection Time: 12/30/20  5:35 PM   Specimen: Nasopharyngeal Swab; Nasopharyngeal(NP) swabs in vial transport medium  Result Value Ref Range Status   SARS Coronavirus 2 by RT PCR NEGATIVE NEGATIVE Final    Comment: (NOTE) SARS-CoV-2 target nucleic acids are NOT DETECTED.  The SARS-CoV-2 RNA is generally detectable in upper respiratory specimens during the acute phase of infection. The lowest concentration of SARS-CoV-2 viral copies this assay can detect is 138 copies/mL. A negative result does not preclude SARS-Cov-2 infection and should not be used as the sole basis for treatment or other patient management decisions. A negative result may occur with  improper specimen collection/handling, submission of specimen  other than nasopharyngeal swab, presence of viral mutation(s) within the areas targeted by this assay, and inadequate number of viral copies(<138 copies/mL). A negative result must be combined with clinical observations, patient history, and epidemiological information. The expected result is Negative.  Fact Sheet for Patients:  BloggerCourse.com  Fact Sheet for Healthcare Providers:  SeriousBroker.it  This test is no t yet approved or cleared by the [place] FDA and  has been authorized for detection and/or diagnosis of SARS-CoV-2 by FDA under an Emergency Use Authorization (EUA). This EUA will remain  in effect (meaning this test can be used) for the duration of the COVID-19 declaration under Section 564(b)(1) of the Act, 21 U.S.C.section 360bbb-3(b)(1), unless the authorization is terminated  or revoked sooner.       Influenza A by PCR NEGATIVE NEGATIVE Final   Influenza B by PCR NEGATIVE NEGATIVE Final    Comment: (NOTE) The Xpert Xpress SARS-CoV-2/FLU/RSV plus assay is intended as an aid in the diagnosis of influenza from Nasopharyngeal swab specimens and should not be used as a sole basis for treatment. Nasal washings and aspirates are unacceptable for Xpert Xpress SARS-CoV-2/FLU/RSV testing.  Fact Sheet for Patients: BloggerCourse.com  Fact Sheet for Healthcare Providers: SeriousBroker.it  This test is not yet approved or cleared by the [place] FDA and has been authorized for detection and/or diagnosis of SARS-CoV-2 by FDA under an Emergency Use Authorization (EUA). This  EUA will remain in effect (meaning this test can be used) for the duration of the COVID-19 declaration under Section 564(b)(1) of the Act, 21 U.S.C. section 360bbb-3(b)(1), unless the authorization is terminated or revoked.  Performed at [facility], [address]  [place], [place] [address]   C Difficile Quick Screen w PCR reflex     Status: None   Collection Time: 12/30/20  5:35 PM   Specimen: STOOL  Result Value Ref Range Status   C Diff antigen NEGATIVE NEGATIVE Final   C Diff toxin NEGATIVE NEGATIVE Final   C Diff interpretation No C. difficile detected.  Final    Comment: Performed at [facility], [address] [place], [place] [address]  Gastrointestinal Panel by PCR , Stool     Status: None   Collection Time: 12/30/20  5:35 PM   Specimen: STOOL  Result Value Ref Range Status   Campylobacter species NOT DETECTED NOT DETECTED Final   Plesimonas shigelloides NOT DETECTED NOT DETECTED Final   Salmonella species NOT DETECTED NOT DETECTED Final   Yersinia enterocolitica NOT DETECTED NOT DETECTED Final   Vibrio species NOT DETECTED NOT DETECTED Final   Vibrio cholerae NOT DETECTED NOT DETECTED Final   Enteroaggregative E coli (EAEC) NOT DETECTED NOT DETECTED Final   Enteropathogenic E coli (EPEC) NOT DETECTED NOT DETECTED Final   Enterotoxigenic E coli (ETEC) NOT DETECTED NOT DETECTED Final   Shiga like toxin producing E coli (STEC) NOT DETECTED NOT DETECTED Final   Shigella/Enteroinvasive E coli (EIEC) NOT DETECTED NOT DETECTED Final   Cryptosporidium NOT DETECTED NOT DETECTED Final   Cyclospora cayetanensis NOT DETECTED NOT DETECTED Final   Entamoeba histolytica NOT DETECTED NOT DETECTED Final   Giardia lamblia NOT DETECTED NOT DETECTED Final   Adenovirus F40/41 NOT DETECTED NOT DETECTED Final   Astrovirus NOT DETECTED NOT DETECTED Final   Norovirus GI/GII NOT DETECTED NOT DETECTED Final   Rotavirus A NOT DETECTED NOT DETECTED Final   Sapovirus (I, II, IV, and V) NOT DETECTED NOT DETECTED Final    Comment: Performed at [facility], [address] [place], [place] [address]    Labs: CBC: Recent Labs  Lab 01/11/24 0754 01/11/24 1037 01/12/24 0554  WBC 11.7* 10.6* 7.1  HGB 18.6* 17.5* 15.8  HCT RESULTS  UNAVAILABLE DUE TO INTERFERING SUBSTANCE 46.9 42.6  MCV RESULTS UNAVAILABLE DUE TO INTERFERING SUBSTANCE 85.7 84.5  PLT 247 196 178   Basic Metabolic Panel: Recent Labs  Lab 01/11/24 0853 01/12/24 0554 01/13/24 0517  NA 134* 134* 135  K 4.6 4.0 3.6  CL 102 106 104  CO2 20* 19* 22  GLUCOSE 351* 231* 189*  BUN 16 15 14   CREATININE 0.95 0.81 0.86  CALCIUM 9.0 8.1* 8.8*  MG  --   --  1.9  PHOS  --   --  2.8   Liver Function Tests: Recent Labs  Lab 01/11/24 0853 01/12/24 0554  AST 39 44*  ALT 66* 55*  ALKPHOS 84 64  BILITOT 1.5* 1.4*  PROT 8.0 6.5  ALBUMIN 4.6 3.8   CBG: Recent Labs  Lab 01/12/24 0735 01/12/24 1129 01/12/24 1734 01/12/24 2047 01/13/24 0743  GLUCAP 201* 247* 274* 266* 196*    Discharge time spent: greater than 30 minutes.  Signed: Marrion Coy, MD [facility] 01/13/2024

## 2024-01-13 NOTE — Plan of Care (Signed)

## 2024-01-13 NOTE — TOC CM/SW Note (Signed)
 Transition of Care [facility]) - Inpatient Brief Assessment   Patient Details  Name: Brent [name] MRN: [id number] Date of Birth: 1986-09-23  Transition of Care [facility]) CM/SW Contact:    Rodney Langton, RN Phone Number: 01/13/2024, 10:49 AM   Clinical Narrative:  Brief assessment done, noted need for PCP.  Options for new PCP added to AVS.   Transition of Care Asessment: Insurance and Status: Insurance coverage has been reviewed Patient has primary care physician: No Home environment has been reviewed: Yes Prior level of function:: Independent Prior/Current Home Services: No current home services Social Drivers of Health Review: SDOH reviewed no interventions necessary Readmission risk has been reviewed: No Transition of care needs: no transition of care needs at this time

## 2024-01-13 NOTE — Plan of Care (Signed)
  Problem: Education: Goal: Ability to describe self-care measures that may prevent or decrease complications (Diabetes Survival Skills Education) will improve Outcome: Progressing   Problem: Coping: Goal: Ability to adjust to condition or change in health will improve Outcome: Progressing   Problem: Fluid Volume: Goal: Ability to maintain a balanced intake and output will improve Outcome: Progressing   Problem: Health Behavior/Discharge Planning: Goal: Ability to identify and utilize available resources and services will improve Outcome: Progressing   Problem: Metabolic: Goal: Ability to maintain appropriate glucose levels will improve Outcome: Progressing   Problem: Nutritional: Goal: Maintenance of adequate nutrition will improve Outcome: Progressing   Problem: Skin Integrity: Goal: Risk for impaired skin integrity will decrease Outcome: Progressing   Problem: Nutrition: Goal: Adequate nutrition will be maintained Outcome: Progressing   Problem: Coping: Goal: Level of anxiety will decrease Outcome: Progressing   Problem: Elimination: Goal: Will not experience complications related to bowel motility Outcome: Progressing

## 2024-04-17 ENCOUNTER — Ambulatory Visit: Admission: EM | Admit: 2024-04-17 | Discharge: 2024-04-17 | Disposition: A | Discharge status: Home / Self Care

## 2024-04-17 ENCOUNTER — Encounter: Payer: Self-pay | Admitting: Emergency Medicine

## 2024-04-17 DIAGNOSIS — S61213A Laceration without foreign body of left middle finger without damage to nail, initial encounter: Secondary | ICD-10-CM

## 2024-04-17 HISTORY — DX: Gastro-esophageal reflux disease without esophagitis: K21.9

## 2024-04-17 HISTORY — DX: Type 2 diabetes mellitus without complications: E11.9

## 2024-04-17 MED ORDER — TETANUS-DIPHTH-ACELL PERTUSSIS 5-2.5-18.5 LF-MCG/0.5 IM SUSY: 0.5000 mL | PREFILLED_SYRINGE | Freq: Once | INTRAMUSCULAR | Status: DC

## 2024-04-17 NOTE — ED Triage Notes (Signed)
 Patient reports that he cut his left middle finger on razor blade about hour ago. Bleeding controlled at this time.

## 2024-04-17 NOTE — ED Provider Notes (Signed)
 Brent [name]    CSN: [id number] Arrival date & time: 04/17/24  1509      History   Chief Complaint Chief Complaint  Patient presents with   Laceration    HPI Brent [name] is a 38 y.o. male.   Patient presents for evaluation of a laceration to the left middle finger that occurred within an hour.  Finger was cut on a brand-new razor blade.  Bleeding controlled.  Has full range of motion.  Denies numbness or tingling.  Has not attempted treatment.  Past Medical History:  Diagnosis Date   Acid reflux    Diabetes mellitus without complication (HCC)    Ulcerative colitis (HCC)     Patient Active Problem List   Diagnosis Date Noted   Class 1 obesity 01/13/2024   Normal anion gap metabolic acidosis 01/12/2024   Abdominal pain 01/12/2024   Generalized abdominal cramping 01/11/2024   Serum total bilirubin elevated 01/11/2024   Elevated LFTs 01/11/2024   Hyperglycemia 01/11/2024   Hyperglycemia due to diabetes mellitus (HCC) 01/11/2024   Essential hypertension 01/11/2024   Hepatic steatosis 01/11/2024   Deficient knowledge of diet 01/11/2024   Pancreatitis 10/21/2022   Attention deficit hyperactivity disorder 01/14/2021   Ulcerative colitis (HCC) 12/30/2020   Pancreatic lesion 12/30/2020   Adult ADHD 12/30/2020   Tobacco abuse 12/30/2020   Depression 12/30/2020   BMI 31.0-31.9,adult 02/07/2019   Encounter for long-term (current) use of other medications 02/07/2019   Gastroesophageal reflux disease without esophagitis 02/07/2019   Nicotine  dependence, other tobacco product, uncomplicated 02/07/2019    Past Surgical History:  Procedure Laterality Date   COLONOSCOPY WITH PROPOFOL  N/A 01/01/2021   Procedure: COLONOSCOPY WITH PROPOFOL ;  Surgeon: Unk Corinn Skiff, MD;  Location: [facility] ENDOSCOPY;  Service: Gastroenterology;  Laterality: N/A;   TYMPANOSTOMY TUBE PLACEMENT         Home Medications    Prior to Admission medications   Medication Sig Start Date  End Date Taking? Authorizing Provider  amphetamine -dextroamphetamine  (ADDERALL XR) 15 MG 24 hr capsule Take 15 mg by mouth every morning.    [provider]  Blood Glucose Monitoring Suppl DEVI 1 each by Does not apply route 3 (three) times daily. May dispense any manufacturer covered by patient's insurance. 01/13/24   Laurita Pillion, MD  Continuous Glucose Sensor (FREESTYLE LIBRE 3 PLUS SENSOR) MISC SMARTSIG:1 Once a Week    [provider]  Glucose Blood (BLOOD GLUCOSE TEST STRIPS) STRP 1 each by Does not apply route 3 (three) times daily. Use as directed to check blood sugar. May dispense any manufacturer covered by patient's insurance and fits patient's device. 01/13/24   Laurita Pillion, MD  insulin  glargine (LANTUS ) 100 UNIT/ML Solostar Pen Inject 16 Units into the skin daily. May substitute as needed per insurance. 01/13/24   Laurita Pillion, MD  insulin  lispro (HUMALOG ) 100 UNIT/ML KwikPen Inject 4 Units into the skin 3 (three) times daily with meals. If eating and Blood Glucose (BG) 80 or higher inject 4 units for meal coverage and add correction dose per scale. If not eating, correction dose only. BG <150= 0 unit; BG 150-200= 1 unit; BG 201-250= 2 unit; BG 251-300= 3 unit; BG 301-350= 4 unit; BG 351-400= 5 unit; BG >400= 6 unit and Call Primary Care. 01/13/24   Laurita Pillion, MD  Insulin  Pen Needle (PEN NEEDLES) 31G X 5 MM MISC 1 each by Does not apply route 3 (three) times daily. May dispense any manufacturer covered by patient's insurance. 01/13/24  Laurita Pillion, MD  Lancet Device MISC 1 each by Does not apply route 3 (three) times daily. May dispense any manufacturer covered by patient's insurance. 01/13/24   Laurita Pillion, MD  Lancets MISC 1 each by Does not apply route 3 (three) times daily. Use as directed to check blood sugar. May dispense any manufacturer covered by patient's insurance and fits patient's device. 01/13/24   Laurita Pillion, MD  nicotine  (NICODERM CQ  - DOSED IN MG/24 HOURS) 21  mg/24hr patch Place 1 patch (21 mg total) onto the skin daily as needed (nicotine  craving). 01/13/24   Laurita Pillion, MD  omeprazole  (PRILOSEC OTC) 20 MG tablet Take 40 mg by mouth daily.    [provider]    Family History Family History  Problem Relation Age of Onset   Pancreatitis Father     Social History Social History   Tobacco Use   Smoking status: Every Day    Types: Cigarettes   Smokeless tobacco: Never  Vaping Use   Vaping status: Every Day  Substance Use Topics   Alcohol use: Not Currently   Drug use: Never     Allergies   Wasp venom protein   Review of Systems Review of Systems   Physical Exam Triage Vital Signs ED Triage Vitals  Encounter Vitals Group     BP 04/17/24 1516 121/87     Girls Systolic BP Percentile --      Girls Diastolic BP Percentile --      Boys Systolic BP Percentile --      Boys Diastolic BP Percentile --      Pulse Rate 04/17/24 1516 (!) 109     Resp 04/17/24 1516 20     Temp 04/17/24 1516 98.7 F (37.1 C)     Temp Source 04/17/24 1516 Oral     SpO2 04/17/24 1516 98 %     Weight --      Height --      Head Circumference --      Peak Flow --      Pain Score 04/17/24 1518 5     Pain Loc --      Pain Education --      Exclude from Growth Chart --    No data found.  Updated Vital Signs BP 121/87 (BP Location: Right Arm)   Pulse (!) 109   Temp 98.7 F (37.1 C) (Oral)   Resp 20   SpO2 98%   Visual Acuity Right Eye Distance:   Left Eye Distance:   Bilateral Distance:    Right Eye Near:   Left Eye Near:    Bilateral Near:     Physical Exam Constitutional:      Appearance: Normal appearance.  Eyes:     Extraocular Movements: Extraocular movements intact.  Pulmonary:     Effort: Pulmonary effort is normal.  Neurological:     Mental Status: He is alert and oriented to person, place, and time. Mental status is at baseline.      UC Treatments / Results  Labs (all labs ordered are listed, but only  abnormal results are displayed) Labs Reviewed - No data to display  EKG   Radiology No results found.  Procedures Laceration Repair  Date/Time: 04/17/2024 4:10 PM  Performed by: Teresa Shelba SAUNDERS, NP Authorized by: Teresa Shelba SAUNDERS, NP   Consent:    Consent obtained:  Verbal   Consent given by:  Patient   Risks, benefits, and alternatives were discussed: yes  Risks discussed:  Infection and pain Universal protocol:    Procedure explained and questions answered to patient or proxy's satisfaction: yes     Patient identity confirmed:  Verbally with patient Anesthesia:    Anesthesia method:  Local infiltration   Local anesthetic:  Lidocaine  2% w/o epi Laceration details:    Location:  Finger   Finger location:  L long finger   Length (cm):  2   Depth (mm):  0.5 Exploration:    Wound exploration: entire depth of wound visualized   Treatment:    Area cleansed with:  Chlorhexidine   Amount of cleaning:  Standard Skin repair:    Repair method:  Sutures   Suture size:  4-0   Suture material:  Prolene   Suture technique:  Simple interrupted   Number of sutures:  4 Approximation:    Approximation:  Close Repair type:    Repair type:  Simple Post-procedure details:    Dressing:  Non-adherent dressing   Procedure completion:  Tolerated  (including critical care time)  Medications Ordered in UC Medications - No data to display  Initial Impression / Assessment and Plan / UC Course  I have reviewed the triage vital signs and the nursing notes.  Pertinent labs & imaging results that were available during my care of the patient were reviewed by me and considered in my medical decision making (see chart for details).    Laceration of left middle finger without foreign body without damage to the nail  4 sutures placed advise removal in 12 to 14 days, advise daily cleansing with soap and water, may leave open to air cover if at risk for contamination advised to monitor for  signs of infection to return for reevaluation as needed for pain recommend over-the-counter analgesics, advise follow-up for any concerns regarding healing Final Clinical Impressions(s) / UC Diagnoses   Final diagnoses:  Laceration of left middle finger without foreign body without damage to nail, initial encounter     Discharge Instructions      Today you were evaluated for the cut to your hand  4 sutures have been placed, return in 12 to 14 days for removal  Cleanse over the affected area with unscented soap and water, pat dry do not rub, may leave open to air cover with a nonstick bandage if at risk for becoming contaminated  Redness shot has been updated, good for 10 years  May continue activity for what is tolerated  Take Tylenol  and Motrin as needed for   ED Prescriptions   None    PDMP not reviewed this encounter.   Teresa Shelba SAUNDERS, [place] 04/17/24 [phone]

## 2024-04-17 NOTE — Discharge Instructions (Addendum)
 Today you were evaluated for the cut to your hand  4 sutures have been placed, return in 12 to 14 days for removal  Cleanse over the affected area with unscented soap and water, pat dry do not rub, may leave open to air cover with a nonstick bandage if at risk for becoming contaminated  Redness shot has been updated, good for 10 years  May continue activity for what is tolerated  Take Tylenol  and Motrin as needed for
